# Patient Record
Sex: Female | Born: 1965 | Race: White | Hispanic: No | Marital: Single | Smoking: Former smoker
Health system: Southern US, Community
[De-identification: ages and names within clinical notes are randomized; demographics above are authoritative.]

## PROBLEM LIST (undated history)

## (undated) DIAGNOSIS — E785 Hyperlipidemia, unspecified: Secondary | ICD-10-CM

## (undated) DIAGNOSIS — M1990 Unspecified osteoarthritis, unspecified site: Secondary | ICD-10-CM

## (undated) DIAGNOSIS — F32A Depression, unspecified: Secondary | ICD-10-CM

## (undated) DIAGNOSIS — T7840XA Allergy, unspecified, initial encounter: Secondary | ICD-10-CM

## (undated) HISTORY — PX: EYE SURGERY: SHX253

## (undated) HISTORY — PX: KNEE ARTHROSCOPY W/ MENISCECTOMY: SHX1879

## (undated) HISTORY — PX: JOINT REPLACEMENT: SHX530

## (undated) HISTORY — PX: FRACTURE SURGERY: SHX138

## (undated) HISTORY — DX: Depression, unspecified: F32.A

## (undated) HISTORY — DX: Hyperlipidemia, unspecified: E78.5

## (undated) HISTORY — PX: PERCUTANEOUS PINNING METATARSAL FRACTURE: SUR1015

## (undated) HISTORY — PX: OTHER SURGICAL HISTORY: SHX169

## (undated) HISTORY — DX: Allergy, unspecified, initial encounter: T78.40XA

---

## 2011-09-23 DIAGNOSIS — B009 Herpesviral infection, unspecified: Secondary | ICD-10-CM | POA: Insufficient documentation

## 2017-02-04 ENCOUNTER — Encounter: Payer: Self-pay | Admitting: *Deleted

## 2017-02-04 ENCOUNTER — Ambulatory Visit
Admission: EM | Admit: 2017-02-04 | Discharge: 2017-02-04 | Disposition: A | Discharge status: Home / Self Care | Payer: PRIVATE HEALTH INSURANCE | Attending: Family Medicine | Admitting: Family Medicine

## 2017-02-04 DIAGNOSIS — R05 Cough: Secondary | ICD-10-CM

## 2017-02-04 DIAGNOSIS — R059 Cough, unspecified: Secondary | ICD-10-CM

## 2017-02-04 DIAGNOSIS — J018 Other acute sinusitis: Secondary | ICD-10-CM

## 2017-02-04 LAB — RAPID STREP SCREEN (MED CTR MEBANE ONLY): Streptococcus, Group A Screen (Direct): NEGATIVE

## 2017-02-04 MED ORDER — FLUTICASONE PROPIONATE 50 MCG/ACT NA SUSP
2.0000 | Freq: Every day | NASAL | 0 refills | Status: DC
Start: 2017-02-04 — End: 2023-01-02

## 2017-02-04 MED ORDER — LORATADINE-PSEUDOEPHEDRINE ER 10-240 MG PO TB24: 1.0000 | ORAL_TABLET | Freq: Every day | ORAL | 0 refills | Status: DC

## 2017-02-04 MED ORDER — AMOXICILLIN-POT CLAVULANATE 875-125 MG PO TABS: 1.0000 | ORAL_TABLET | Freq: Two times a day (BID) | ORAL | 0 refills | Status: DC

## 2017-02-04 NOTE — ED Provider Notes (Signed)
[facility]    CSN: [id number] Arrival date & time: 02/04/17  1503     History   Chief Complaint Chief Complaint  Patient presents with  . Cough  . Headache  . Nasal Congestion    HPI Ashley Garcia is a 51 y.o. female.   The history is provided by the patient. No language interpreter was used.  Cough  Cough characteristics:  Non-productive Sputum characteristics:  Nondescript Severity:  Moderate Onset quality:  Sudden Timing:  Constant Progression:  Worsening Chronicity:  New Smoker: yes   Context: upper respiratory infection   Relieved by:  Nothing Worsened by:  Nothing Associated symptoms: headaches   Associated symptoms: no chest pain   Risk factors: recent infection   Headache  Associated symptoms: congestion, cough and sinus pressure     History reviewed. No pertinent past medical history.  There are no active problems to display for this patient.   Past Surgical History:  Procedure Laterality Date  . hip replacment Left   . KNEE ARTHROSCOPY W/ MENISCECTOMY Right     OB History    No data available       Home Medications    Prior to Admission medications   Medication Sig Start Date End Date Taking? Authorizing Provider  amoxicillin-clavulanate (AUGMENTIN) 875-125 MG tablet Take 1 tablet by mouth 2 (two) times daily. 02/04/17   Hassan Rowan, MD  fluticasone (FLONASE) 50 MCG/ACT nasal spray Place 2 sprays into both nostrils daily. 02/04/17   Hassan Rowan, MD  loratadine-pseudoephedrine (CLARITIN-D 24 HOUR) 10-240 MG 24 hr tablet Take 1 tablet by mouth daily. 02/04/17   Hassan Rowan, MD    Family History History reviewed. No pertinent family history.  Social History Social History  Substance Use Topics  . Smoking status: Light Tobacco Smoker    Years: 5.00    Types: Cigars  . Smokeless tobacco: Never Used  . Alcohol use Not on file     Allergies   Patient has no allergy information on record.   Review of Systems Review  of Systems  HENT: Positive for congestion, sinus pain and sinus pressure.   Respiratory: Positive for cough.   Cardiovascular: Negative for chest pain.  Neurological: Positive for headaches.  All other systems reviewed and are negative.    Physical Exam Triage Vital Signs ED Triage Vitals  Enc Vitals Group     BP 02/04/17 1602 (!) 145/80     Pulse Rate 02/04/17 1602 96     Resp 02/04/17 1602 16     Temp 02/04/17 1602 98.4 F (36.9 C)     Temp Source 02/04/17 1602 Oral     SpO2 02/04/17 1602 99 %     Weight 02/04/17 1555 210 lb (95.3 kg)     Height 02/04/17 1555 5\' 6"  (1.676 m)     Head Circumference --      Peak Flow --      Pain Score 02/04/17 1556 3     Pain Loc --      Pain Edu? --      Excl. in GC? --    No data found.   Updated Vital Signs BP (!) 145/80 (BP Location: Right Arm)   Pulse 96   Temp 98.4 F (36.9 C) (Oral)   Resp 16   Ht 5\' 6"  (1.676 m)   Wt 210 lb (95.3 kg)   LMP 12/31/2016 (Approximate)   SpO2 99%   BMI 33.89 kg/m   Visual Acuity Right Eye  Distance:   Left Eye Distance:   Bilateral Distance:    Right Eye Near:   Left Eye Near:    Bilateral Near:     Physical Exam  Constitutional: She is oriented to person, place, and time. She appears well-developed and well-nourished.  HENT:  Head: Normocephalic and atraumatic.  Right Ear: Hearing, external ear and ear canal normal. Tympanic membrane is injected and bulging.  Left Ear: Hearing, external ear and ear canal normal. Tympanic membrane is injected and bulging.  Nose: Mucosal edema present. Right sinus exhibits no maxillary sinus tenderness and no frontal sinus tenderness. Left sinus exhibits no maxillary sinus tenderness and no frontal sinus tenderness.  Mouth/Throat: Uvula is midline and oropharynx is clear and moist.  Eyes: Pupils are equal, round, and reactive to light.  Neck: Normal range of motion. Neck supple.  Cardiovascular: Normal rate and regular rhythm.   Pulmonary/Chest:  Effort normal and breath sounds normal.  Musculoskeletal: Normal range of motion.  Lymphadenopathy:    She has cervical adenopathy.  Neurological: She is alert and oriented to person, place, and time.  Skin: Skin is warm.  Vitals reviewed.    UC Treatments / Results  Labs (all labs ordered are listed, but only abnormal results are displayed) Labs Reviewed  RAPID STREP SCREEN (NOT AT [facility])  CULTURE, GROUP A STREP [facility])    EKG  EKG Interpretation None       Radiology No results found.  Procedures Procedures (including critical care time)  Medications Ordered in UC Medications - No data to display   Initial Impression / Assessment and Plan / UC Course  I have reviewed the triage vital signs and the nursing notes.  Pertinent labs & imaging results that were available during my care of the patient were reviewed by me and considered in my medical decision making (see chart for details).    patient will be given Augmentin 875 one tablet twice day and Tessalon Perles cough Allegra-D or Claritin-D 1 tablet daily follow-up PCP if not better to 3 weeks work note given for today and tomorrow as well.   Final Clinical Impressions(s) / UC Diagnoses   Final diagnoses:  Acute non-recurrent sinusitis of other sinus  Cough    New Prescriptions New Prescriptions   AMOXICILLIN-CLAVULANATE (AUGMENTIN) 875-125 MG TABLET    Take 1 tablet by mouth 2 (two) times daily.   FLUTICASONE (FLONASE) 50 MCG/ACT NASAL SPRAY    Place 2 sprays into both nostrils daily.   LORATADINE-PSEUDOEPHEDRINE (CLARITIN-D 24 HOUR) 10-240 MG 24 HR TABLET    Take 1 tablet by mouth daily.   Note: This dictation was prepared with Dragon dictation along with smaller phrase technology. Any transcriptional errors that result from this process are unintentional. Controlled Substance Prescriptions [place] Controlled Substance Registry consulted? Not Applicable   Hassan Rowan, MD 02/04/17 [phone]

## 2017-02-04 NOTE — ED Triage Notes (Signed)
C/O cold like symptoms. Chest tightness when coughing . Denies fever or soar throat .

## 2017-02-08 LAB — CULTURE, GROUP A STREP (THRC)

## 2017-02-10 ENCOUNTER — Telehealth: Payer: Self-pay | Admitting: *Deleted

## 2017-02-10 NOTE — Telephone Encounter (Signed)
Patient returned phone message. Verified DOB, communicated negative strep culture result. Patient reported persistent symptoms of nasal congestion and sinus pressure. Advised patient to follow up with [facility] or PCP for additional treatment.

## 2017-02-19 ENCOUNTER — Encounter: Payer: Self-pay | Admitting: Internal Medicine

## 2017-02-19 ENCOUNTER — Ambulatory Visit (INDEPENDENT_AMBULATORY_CARE_PROVIDER_SITE_OTHER): Payer: PRIVATE HEALTH INSURANCE | Admitting: Internal Medicine

## 2017-02-19 VITALS — BP 118/74 | HR 71 | Temp 98.5°F | Ht 66.0 in | Wt 202.0 lb

## 2017-02-19 DIAGNOSIS — H6502 Acute serous otitis media, left ear: Secondary | ICD-10-CM | POA: Diagnosis not present

## 2017-02-19 DIAGNOSIS — Z8776 Personal history of (corrected) congenital malformations of integument, limbs and musculoskeletal system: Secondary | ICD-10-CM | POA: Diagnosis not present

## 2017-02-19 DIAGNOSIS — A6009 Herpesviral infection of other urogenital tract: Secondary | ICD-10-CM | POA: Insufficient documentation

## 2017-02-19 DIAGNOSIS — Z87768 Personal history of other specified (corrected) congenital malformations of integument, limbs and musculoskeletal system: Secondary | ICD-10-CM | POA: Insufficient documentation

## 2017-02-19 MED ORDER — PREDNISONE 10 MG PO TABS: ORAL_TABLET | ORAL | 0 refills | Status: DC

## 2017-02-19 MED ORDER — AZITHROMYCIN 250 MG PO TABS: ORAL_TABLET | ORAL | 0 refills | Status: DC

## 2017-02-19 NOTE — Progress Notes (Signed)
Date:  02/19/2017   Name:  Ashley Garcia   DOB:  [birth year]   MRN:  [id number]   Chief Complaint: Establish Care (New to the area- Needs PCP. From [place] ) and Sinusitis (3 week ago. Went to UC 2 week ago. No production. Took antibiotics for 10 days. Sometimes when blowing nose ears pop, and still feels full in sinus. ) Sinusitis  This is a recurrent problem. The problem has been gradually improving since onset. There has been no fever. The pain is mild. Associated symptoms include ear pain, sinus pressure and a sore throat. Pertinent negatives include no chills, coughing, headaches, hoarse voice or shortness of breath. Past treatments include antibiotics.  Completed a course of Augmentin and Flonase. Also took Claritin-D. The pressure is better but she still feels a popping in both ears. Also alteration of taste and smell.    Review of Systems  Constitutional: Negative for chills, fever and unexpected weight change.  HENT: Positive for ear pain, sinus pressure and sore throat. Negative for hoarse voice.   Respiratory: Negative for cough, chest tightness and shortness of breath.   Cardiovascular: Negative for chest pain and palpitations.  Neurological: Negative for dizziness and headaches.  Psychiatric/Behavioral: Positive for dysphoric mood.    There are no active problems to display for this patient.   Prior to Admission medications   Medication Sig Start Date End Date Taking? Authorizing Provider  fluticasone (FLONASE) 50 MCG/ACT nasal spray Place 2 sprays into both nostrils daily. 02/04/17  Yes Hassan Rowan, MD  loratadine-pseudoephedrine (CLARITIN-D 24 HOUR) 10-240 MG 24 hr tablet Take 1 tablet by mouth daily. 02/04/17  Yes Hassan Rowan, MD  Multiple Vitamins-Minerals (MULTIVITAMIN WITH MINERALS) tablet Take 1 tablet by mouth daily.   Yes [provider]  Omega-3 Fatty Acids (FISH OIL) 1000 MG CAPS Take by mouth.   Yes [provider]    No Known  Allergies  Past Surgical History:  Procedure Laterality Date  . hip replacment Left   . KNEE ARTHROSCOPY W/ MENISCECTOMY Right     Social History  Substance Use Topics  . Smoking status: Light Tobacco Smoker    Years: 5.00    Types: Cigars  . Smokeless tobacco: Never Used     Comment: twice a month- cigar  . Alcohol use 4.2 oz/week    7 Glasses of wine per week     Medication list has been reviewed and updated.  PHQ 2/9 Scores 02/19/2017  PHQ - 2 Score 1  PHQ- 9 Score 4    Physical Exam  Constitutional: She is oriented to person, place, and time. She appears well-developed. No distress.  HENT:  Head: Normocephalic and atraumatic.  Right Ear: Ear canal normal. Tympanic membrane is erythematous. Tympanic membrane is not retracted.  Left Ear: Ear canal normal. Tympanic membrane is erythematous. Tympanic membrane is not retracted. A middle ear effusion is present.  Nose: Right sinus exhibits no maxillary sinus tenderness and no frontal sinus tenderness. Left sinus exhibits no maxillary sinus tenderness and no frontal sinus tenderness.  Mouth/Throat: Mucous membranes are dry. No oropharyngeal exudate or posterior oropharyngeal erythema.  Neck: Normal range of motion. Neck supple. No thyromegaly present.  Cardiovascular: Normal rate, regular rhythm and normal heart sounds.   Pulmonary/Chest: Effort normal and breath sounds normal. No respiratory distress. She has no decreased breath sounds. She has no wheezes. She has no rhonchi.  Musculoskeletal: Normal range of motion.  Neurological: She is alert and oriented to person, place,  and time.  Skin: Skin is warm and dry. No rash noted.  Psychiatric: She has a normal mood and affect. Her speech is normal and behavior is normal. Thought content is delusional.  Nursing note and vitals reviewed.   BP 118/74   Pulse 71   Temp 98.5 F (36.9 C)   Ht 5\' 6"  (1.676 m)   Wt 202 lb (91.6 kg)   LMP 01/01/2017 (Approximate)   SpO2 98%    BMI 32.60 kg/m   Assessment and Plan: 1. Acute serous otitis media of left ear, recurrence not specified - azithromycin (ZITHROMAX Z-PAK) 250 MG tablet; UAD  Dispense: 6 each; Refill: 0 - predniSONE (DELTASONE) 10 MG tablet; Take 6 on day 1, 5 on day 2, 4 on day 3, 3 on day 4, 2 on day 5 and 1 on day 1 then stop.  Dispense: 21 tablet; Refill: 0  2. Herpes genitalis in women Continue Valtrex PRN  3. History of congenital dysplasia of hip Doing well s/p hip replacement at age 51   Meds ordered this encounter  Medications  . azithromycin (ZITHROMAX Z-PAK) 250 MG tablet    Sig: UAD    Dispense:  6 each    Refill:  0  . predniSONE (DELTASONE) 10 MG tablet    Sig: Take 6 on day 1, 5 on day 2, 4 on day 3, 3 on day 4, 2 on day 5 and 1 on day 1 then stop.    Dispense:  21 tablet    Refill:  0    Partially dictated using Animal nutritionistDragon software. Any errors are unintentional.  Bari EdwardLaura Shareta Fishbaugh, MD [facility]  02/19/2017

## 2017-05-21 ENCOUNTER — Other Ambulatory Visit: Payer: Self-pay | Admitting: Internal Medicine

## 2017-05-21 ENCOUNTER — Encounter: Payer: Self-pay | Admitting: Internal Medicine

## 2017-05-21 ENCOUNTER — Ambulatory Visit (INDEPENDENT_AMBULATORY_CARE_PROVIDER_SITE_OTHER): Payer: PRIVATE HEALTH INSURANCE | Admitting: Internal Medicine

## 2017-05-21 VITALS — BP 140/82 | HR 83 | Ht 66.0 in | Wt 213.0 lb

## 2017-05-21 DIAGNOSIS — F321 Major depressive disorder, single episode, moderate: Secondary | ICD-10-CM | POA: Diagnosis not present

## 2017-05-21 MED ORDER — CITALOPRAM HYDROBROMIDE 20 MG PO TABS: 20.0000 mg | ORAL_TABLET | Freq: Every day | ORAL | 3 refills | Status: DC

## 2017-05-21 NOTE — Progress Notes (Signed)
Date:  05/21/2017   Name:  Ashley PersonJuliane Wilfong   DOB:  [birth year]   MRN:  [id number]   Chief Complaint: Depression (PHQ9- 17. GAD7- 12)  Depression         This is a recurrent problem.  The onset quality is undetermined.   The problem has been gradually worsening since onset.  Associated symptoms include decreased concentration, decreased interest and sad.  Associated symptoms include no fatigue and no suicidal ideas.     The symptoms are aggravated by social issues and work stress.  Past treatments include nothing. She has started doing yoga.  She has no hx of depression but both parents were treated for depression. She is having trouble finding a full time job.  She is networking but having trouble really meeting people.  She is skipping some social activities due to mild anxiety which is new for her.  Review of Systems  Constitutional: Negative for chills, fatigue and fever.  Respiratory: Negative for chest tightness and shortness of breath.   Cardiovascular: Negative for chest pain and palpitations.  Hematological: Negative for adenopathy.  Psychiatric/Behavioral: Positive for decreased concentration and depression. Negative for sleep disturbance and suicidal ideas. The patient is nervous/anxious.     Patient Active Problem List   Diagnosis Date Noted  . History of congenital dysplasia of hip 02/19/2017  . Herpes genitalis in women 02/19/2017    Prior to Admission medications   Medication Sig Start Date End Date Taking? Authorizing Provider  fluticasone (FLONASE) 50 MCG/ACT nasal spray Place 2 sprays into both nostrils daily. 02/04/17   Hassan RowanWade, Eugene, MD  Multiple Vitamins-Minerals (MULTIVITAMIN WITH MINERALS) tablet Take 1 tablet by mouth daily.    [provider]  Omega-3 Fatty Acids (FISH OIL) 1000 MG CAPS Take by mouth.    [provider]  valACYclovir (VALTREX) 1000 MG tablet Take 1,000 mg by mouth 2 (two) times daily as needed.    [provider]     No Known Allergies  Past Surgical History:  Procedure Laterality Date  . hip replacment Left    age 51  . KNEE ARTHROSCOPY W/ MENISCECTOMY Right     Social History   Tobacco Use  . Smoking status: Light Tobacco Smoker    Years: 5.00    Types: Cigars  . Smokeless tobacco: Never Used  . Tobacco comment: twice a month- cigar  Substance Use Topics  . Alcohol use: Yes    Alcohol/week: 4.2 oz    Types: 7 Glasses of wine per week  . Drug use: No     Medication list has been reviewed and updated.  PHQ 2/9 Scores 05/21/2017 02/19/2017  PHQ - 2 Score 6 1  PHQ- 9 Score 17 4    Physical Exam  Constitutional: She is oriented to Garcia, place, and time. She appears well-developed. No distress.  HENT:  Head: Normocephalic and atraumatic.  Right Ear: Tympanic membrane and ear canal normal.  Left Ear: Tympanic membrane and ear canal normal.  Neck: Normal range of motion. Neck supple.  Cardiovascular: Normal rate, regular rhythm and normal heart sounds.  Pulmonary/Chest: Effort normal and breath sounds normal. No respiratory distress. She has no wheezes.  Musculoskeletal: Normal range of motion.  Neurological: She is alert and oriented to Garcia, place, and time.  Skin: Skin is warm and dry. No rash noted.  Psychiatric: She has a normal mood and affect. Her speech is normal and behavior is normal. Thought content normal.  Nursing note and vitals  reviewed.   BP 140/82   Pulse 83   Ht 5\' 6"  (1.676 m)   Wt 213 lb (96.6 kg)   SpO2 98%   BMI 34.38 kg/m   Assessment and Plan: 1. Current moderate episode of major depressive disorder without prior episode (HCC) Begin medication Follow up if no improvement in 4 weeks or if sx worsen - citalopram (CELEXA) 20 MG tablet; Take 1 tablet (20 mg total) by mouth daily.  Dispense: 30 tablet; Refill: 3   Meds ordered this encounter  Medications  . citalopram (CELEXA) 20 MG tablet    Sig: Take 1 tablet (20 mg total) by mouth daily.     Dispense:  30 tablet    Refill:  3    Partially dictated using Animal nutritionistDragon software. Any errors are unintentional.  Bari EdwardLaura Uniqua Kihn, MD [facility]  05/21/2017

## 2017-05-21 NOTE — Patient Instructions (Signed)
Take 1/2 tablet daily for 4 days then increase to one tablet daily.

## 2017-06-26 ENCOUNTER — Other Ambulatory Visit: Payer: Self-pay | Admitting: Internal Medicine

## 2017-06-26 ENCOUNTER — Telehealth: Payer: Self-pay

## 2017-06-26 DIAGNOSIS — F321 Major depressive disorder, single episode, moderate: Secondary | ICD-10-CM

## 2017-06-26 MED ORDER — FLUOXETINE HCL 20 MG PO TABS: 20.0000 mg | ORAL_TABLET | Freq: Every day | ORAL | 1 refills | Status: DC

## 2017-06-26 NOTE — Telephone Encounter (Signed)
She will likely find thinning hair reported as a possible side effect of many medications.  If she does not have bald patches and is doing better on medication, then I would encourage her to continue it.  If she insists on a change, I can prescribe something else.

## 2017-06-26 NOTE — Telephone Encounter (Signed)
Patient called stating she was just started on Celexa medication a month ago. Noticed hair was thinning- did her own research and found this is a side affect of the medication. Wanted to know if there is something else she can try or what to do? Please Advise.

## 2017-06-26 NOTE — Telephone Encounter (Signed)
Patient stated she insists on medication change. She notices her scalp now hwen her hair is wet and heavy. Please send new medication to [facility] in [place]. Made her a 6 weeks follow up for this new medication.

## 2017-08-06 ENCOUNTER — Ambulatory Visit: Payer: Self-pay | Admitting: Internal Medicine

## 2017-08-19 ENCOUNTER — Ambulatory Visit (INDEPENDENT_AMBULATORY_CARE_PROVIDER_SITE_OTHER): Payer: BLUE CROSS/BLUE SHIELD | Admitting: Internal Medicine

## 2017-08-19 ENCOUNTER — Encounter: Payer: Self-pay | Admitting: Internal Medicine

## 2017-08-19 DIAGNOSIS — F321 Major depressive disorder, single episode, moderate: Secondary | ICD-10-CM | POA: Diagnosis not present

## 2017-08-19 DIAGNOSIS — Z124 Encounter for screening for malignant neoplasm of cervix: Secondary | ICD-10-CM | POA: Diagnosis not present

## 2017-08-19 MED ORDER — FLUOXETINE HCL 20 MG PO TABS: 20.0000 mg | ORAL_TABLET | Freq: Every day | ORAL | 5 refills | Status: DC

## 2017-08-19 NOTE — Patient Instructions (Signed)
Call [facility] if [facility] cover cologuard [phone] CPT code  Dr. Tildon HuskySteinbricker - dentist  [facility] [place]

## 2017-08-19 NOTE — Progress Notes (Signed)
Date:  08/19/2017   Name:  Ashley Garcia   DOB:  1966/03/06   MRN:  [id number]   Chief Complaint: Depression Depression         This is a new problem.  The current episode started more than 1 month ago.   The onset quality is gradual.   The problem has been rapidly improving since onset.  Associated symptoms include no fatigue, no appetite change and no headaches.     The symptoms are aggravated by family issues and social issues.  Past treatments include SSRIs - Selective serotonin reuptake inhibitors.  Compliance with treatment is good.  Previous treatment provided significant relief.  She was started on Celexa but felt that it made her hair fall out so she was changed to Prozac and is here to follow up.  Her hair loss has stopped and she is very satisfied.   Review of Systems  Constitutional: Negative for appetite change, chills, fatigue, fever and unexpected weight change.  HENT: Negative for tinnitus and trouble swallowing.   Eyes: Negative for visual disturbance.  Respiratory: Negative for cough, choking, chest tightness and shortness of breath.   Cardiovascular: Negative for chest pain, palpitations and leg swelling.  Gastrointestinal: Negative for abdominal pain.  Genitourinary: Negative for dysuria and hematuria.  Musculoskeletal: Negative for arthralgias.  Neurological: Negative for tremors, numbness and headaches.  Psychiatric/Behavioral: Positive for depression. Negative for dysphoric mood and sleep disturbance. The patient is not nervous/anxious.     Patient Active Problem List   Diagnosis Date Noted  . Current moderate episode of major depressive disorder without prior episode (HCC) 05/21/2017  . History of congenital dysplasia of hip 02/19/2017  . Herpes genitalis in women 02/19/2017    Prior to Admission medications   Medication Sig Start Date End Date Taking? Authorizing Provider  Ascorbic Acid (VITAMIN C) 1000 MG tablet Take 1,000 mg by mouth daily.   Yes  [provider]  b complex vitamins tablet Take 1 tablet by mouth daily.   Yes [provider]  Biotin 10 MG CAPS Take by mouth.   Yes [provider]  dextromethorphan-guaiFENesin (MUCINEX DM) 30-600 MG 12hr tablet Take 1 tablet by mouth 2 (two) times daily.   Yes [provider]  FLUoxetine (PROZAC) 20 MG tablet Take 1 tablet (20 mg total) by mouth daily. 06/26/17  Yes Reubin Milan, MD  fluticasone [facility]) 50 MCG/ACT nasal spray Place 2 sprays into both nostrils daily. 02/04/17  Yes Hassan Rowan, MD  Multiple Vitamins-Minerals (MULTIVITAMIN WITH MINERALS) tablet Take 1 tablet by mouth daily.   Yes [provider]  Omega-3 Fatty Acids (FISH OIL) 1000 MG CAPS Take by mouth.   Yes [provider]  Probiotic Product (PROBIOTIC-10 PO) Take by mouth.   Yes [provider]  valACYclovir (VALTREX) 1000 MG tablet Take 1,000 mg by mouth 2 (two) times daily as needed.   Yes [provider]    No Known Allergies  Past Surgical History:  Procedure Laterality Date  . hip replacment Left    age 54  . KNEE ARTHROSCOPY W/ MENISCECTOMY Right     Social History   Tobacco Use  . Smoking status: Light Tobacco Smoker    Years: 5.00    Types: Cigars  . Smokeless tobacco: Never Used  . Tobacco comment: twice a month- cigar  Substance Use Topics  . Alcohol use: Yes    Alcohol/week: 4.2 oz    Types: 7 Glasses of wine per  week  . Drug use: No     Medication list has been reviewed and updated.  PHQ 2/9 Scores 08/19/2017 05/21/2017 02/19/2017  PHQ - 2 Score 0 6 1  PHQ- 9 Score 0 17 4    Physical Exam  Constitutional: She is oriented to person, place, and time. She appears well-developed. No distress.  HENT:  Head: Normocephalic and atraumatic.  Neck: Normal range of motion. Neck supple.  Cardiovascular: Normal rate, regular rhythm and normal heart sounds.  Pulmonary/Chest: Effort normal and breath sounds normal. No  respiratory distress. She has no wheezes.  Musculoskeletal: Normal range of motion.  Neurological: She is alert and oriented to person, place, and time.  Skin: Skin is warm and dry. No rash noted.  Psychiatric: She has a normal mood and affect. Her behavior is normal. Thought content normal.  Nursing note and vitals reviewed.   BP 130/80   Pulse 70   Ht 5\' 6"  (1.676 m)   Wt 211 lb (95.7 kg)   SpO2 98%   BMI 34.06 kg/m   Assessment and Plan: 1. Current moderate episode of major depressive disorder without prior episode (HCC) Doing well, continue medication - FLUoxetine (PROZAC) 20 MG tablet; Take 1 tablet (20 mg total) by mouth daily.  Dispense: 30 tablet; Refill: 5  2. Encounter for Pap smear of cervix with HPV DNA cotesting Will need to have this repeated at [facility] this summer   Meds ordered this encounter  Medications  . FLUoxetine (PROZAC) 20 MG tablet    Sig: Take 1 tablet (20 mg total) by mouth daily.    Dispense:  30 tablet    Refill:  5    Partially dictated using Animal nutritionistDragon software. Any errors are unintentional.  Bari EdwardLaura Satish Hammers, MD [facility]  08/19/2017

## 2017-10-24 ENCOUNTER — Other Ambulatory Visit: Payer: Self-pay | Admitting: Internal Medicine

## 2017-10-24 DIAGNOSIS — F321 Major depressive disorder, single episode, moderate: Secondary | ICD-10-CM

## 2017-12-23 ENCOUNTER — Encounter: Payer: Self-pay | Admitting: Internal Medicine

## 2017-12-23 ENCOUNTER — Other Ambulatory Visit (HOSPITAL_COMMUNITY)
Admission: RE | Admit: 2017-12-23 | Discharge: 2017-12-23 | Disposition: A | Discharge status: Home / Self Care | Payer: BLUE CROSS/BLUE SHIELD | Source: Ambulatory Visit | Attending: Internal Medicine | Admitting: Internal Medicine

## 2017-12-23 ENCOUNTER — Ambulatory Visit (INDEPENDENT_AMBULATORY_CARE_PROVIDER_SITE_OTHER): Payer: BLUE CROSS/BLUE SHIELD | Admitting: Internal Medicine

## 2017-12-23 ENCOUNTER — Other Ambulatory Visit: Payer: Self-pay

## 2017-12-23 VITALS — BP 124/68 | HR 87 | Ht 66.0 in | Wt 215.0 lb

## 2017-12-23 DIAGNOSIS — Z1211 Encounter for screening for malignant neoplasm of colon: Secondary | ICD-10-CM | POA: Diagnosis not present

## 2017-12-23 DIAGNOSIS — F321 Major depressive disorder, single episode, moderate: Secondary | ICD-10-CM | POA: Diagnosis not present

## 2017-12-23 DIAGNOSIS — Z1389 Encounter for screening for other disorder: Secondary | ICD-10-CM

## 2017-12-23 DIAGNOSIS — Z124 Encounter for screening for malignant neoplasm of cervix: Secondary | ICD-10-CM | POA: Diagnosis not present

## 2017-12-23 DIAGNOSIS — Z0000 Encounter for general adult medical examination without abnormal findings: Secondary | ICD-10-CM | POA: Diagnosis not present

## 2017-12-23 DIAGNOSIS — Z0001 Encounter for general adult medical examination with abnormal findings: Secondary | ICD-10-CM

## 2017-12-23 DIAGNOSIS — Z1239 Encounter for other screening for malignant neoplasm of breast: Secondary | ICD-10-CM

## 2017-12-23 DIAGNOSIS — Z1231 Encounter for screening mammogram for malignant neoplasm of breast: Secondary | ICD-10-CM

## 2017-12-23 LAB — POCT URINALYSIS DIPSTICK
Bilirubin, UA: NEGATIVE
Blood, UA: NEGATIVE
Glucose, UA: NEGATIVE
Ketones, UA: NEGATIVE
Leukocytes, UA: NEGATIVE
Nitrite, UA: NEGATIVE
Protein, UA: NEGATIVE
Spec Grav, UA: 1.02 (ref 1.010–1.025)
Urobilinogen, UA: 0.2 E.U./dL
pH, UA: 5 (ref 5.0–8.0)

## 2017-12-23 NOTE — Progress Notes (Signed)
Date:  12/23/2017   Name:  Ashley Garcia   DOB:  02-11-1966   MRN:  [id number]   Chief Complaint: Annual Exam Ashley Garcia is a 52 y.o. female who presents today for her Complete Annual Exam. She feels fairly well. She reports exercising none. She reports she is sleeping well. She is due for mammogram.  She is also due for repeat Pap - last year normal Pap with + HPV.  Also has not had a colonoscopy.  Depression         This is a chronic problem.The problem is unchanged.  Associated symptoms include no fatigue and no headaches.  Past treatments include SSRIs - Selective serotonin reuptake inhibitors.  Compliance with treatment is good. She is feeling a little sad today -just lost her beloved dog.  She has also changed jobs and is slightly stressed.    Review of Systems  Constitutional: Negative for chills, fatigue and fever.  HENT: Negative for congestion, hearing loss, tinnitus, trouble swallowing and voice change.   Eyes: Negative for visual disturbance.  Respiratory: Negative for cough, chest tightness, shortness of breath and wheezing.   Cardiovascular: Negative for chest pain, palpitations and leg swelling.  Gastrointestinal: Negative for abdominal pain, constipation, diarrhea and vomiting.  Endocrine: Negative for polydipsia and polyuria.  Genitourinary: Negative for dysuria, frequency, genital sores, vaginal bleeding and vaginal discharge.  Musculoskeletal: Positive for arthralgias (hip pain). Negative for gait problem and joint swelling.  Skin: Negative for color change and rash.  Neurological: Negative for dizziness, tremors, light-headedness and headaches.  Hematological: Negative for adenopathy. Does not bruise/bleed easily.  Psychiatric/Behavioral: Positive for depression. Negative for dysphoric mood and sleep disturbance. The patient is not nervous/anxious.     Patient Active Problem List   Diagnosis Date Noted  . Encounter for Pap smear of cervix with HPV DNA  cotesting 08/19/2017  . Current moderate episode of major depressive disorder without prior episode (HCC) 05/21/2017  . History of congenital dysplasia of hip 02/19/2017  . Herpes genitalis in women 02/19/2017    Prior to Admission medications   Medication Sig Start Date End Date Taking? Authorizing Provider  Ascorbic Acid (VITAMIN C) 1000 MG tablet Take 1,000 mg by mouth daily.   Yes [provider]  b complex vitamins tablet Take 1 tablet by mouth daily.   Yes [provider]  Biotin 10 MG CAPS Take by mouth.   Yes [provider]  dextromethorphan-guaiFENesin (MUCINEX DM) 30-600 MG 12hr tablet Take 1 tablet by mouth 2 (two) times daily.   Yes [provider]  FLUoxetine (PROZAC) 20 MG tablet TAKE 1 TABLET BY MOUTH EVERY DAY 10/24/17  Yes Reubin Milan, MD  fluticasone [facility]) 50 MCG/ACT nasal spray Place 2 sprays into both nostrils daily. 02/04/17  Yes Hassan Rowan, MD  Multiple Vitamins-Minerals (MULTIVITAMIN WITH MINERALS) tablet Take 1 tablet by mouth daily.   Yes [provider]  Omega-3 Fatty Acids (FISH OIL) 1000 MG CAPS Take by mouth.   Yes [provider]  Probiotic Product (PROBIOTIC-10 PO) Take by mouth.   Yes [provider]  valACYclovir (VALTREX) 1000 MG tablet Take 1,000 mg by mouth 2 (two) times daily as needed.   Yes [provider]    No Known Allergies  Past Surgical History:  Procedure Laterality Date  . hip replacment Left    age 67  . KNEE ARTHROSCOPY W/ MENISCECTOMY Right     Social History   Tobacco Use  . Smoking  status: Light Tobacco Smoker    Years: 5.00    Types: Cigars  . Smokeless tobacco: Never Used  . Tobacco comment: twice a month- cigar  Substance Use Topics  . Alcohol use: Yes    Alcohol/week: 4.2 oz    Types: 7 Glasses of wine per week  . Drug use: No     Medication list has been reviewed and updated.  Current Meds  Medication Sig  . Ascorbic Acid (VITAMIN  C) 1000 MG tablet Take 1,000 mg by mouth daily.  Marland Kitchen. b complex vitamins tablet Take 1 tablet by mouth daily.  . Biotin 10 MG CAPS Take by mouth.  . dextromethorphan-guaiFENesin (MUCINEX DM) 30-600 MG 12hr tablet Take 1 tablet by mouth 2 (two) times daily.  Marland Kitchen. FLUoxetine (PROZAC) 20 MG tablet TAKE 1 TABLET BY MOUTH EVERY DAY  . fluticasone (FLONASE) 50 MCG/ACT nasal spray Place 2 sprays into both nostrils daily.  . Multiple Vitamins-Minerals (MULTIVITAMIN WITH MINERALS) tablet Take 1 tablet by mouth daily.  . Omega-3 Fatty Acids (FISH OIL) 1000 MG CAPS Take by mouth.  . Probiotic Product (PROBIOTIC-10 PO) Take by mouth.  . valACYclovir (VALTREX) 1000 MG tablet Take 1,000 mg by mouth 2 (two) times daily as needed.    PHQ 2/9 Scores 12/23/2017 08/19/2017 05/21/2017 02/19/2017  PHQ - 2 Score 2 0 6 1  PHQ- 9 Score 3 0 17 4    Physical Exam  Constitutional: She is oriented to person, place, and time. She appears well-developed and well-nourished. No distress.  HENT:  Head: Normocephalic and atraumatic.  Right Ear: Tympanic membrane and ear canal normal.  Left Ear: Tympanic membrane and ear canal normal.  Nose: Right sinus exhibits no maxillary sinus tenderness. Left sinus exhibits no maxillary sinus tenderness.  Mouth/Throat: Uvula is midline and oropharynx is clear and moist.  Eyes: Conjunctivae and EOM are normal. Right eye exhibits no discharge. Left eye exhibits no discharge. No scleral icterus.  Neck: Normal range of motion. Carotid bruit is not present. No erythema present. No thyromegaly present.  Cardiovascular: Normal rate, regular rhythm, normal heart sounds and normal pulses.  Pulmonary/Chest: Effort normal. No respiratory distress. She has no wheezes. Right breast exhibits no mass, no nipple discharge, no skin change and no tenderness. Left breast exhibits no mass, no nipple discharge, no skin change and no tenderness.  Abdominal: Soft. Bowel sounds are normal. There is no  hepatosplenomegaly. There is no tenderness. There is no CVA tenderness.  Genitourinary: Rectum normal, vagina normal and uterus normal. There is no rash, tenderness or lesion on the right labia. There is no rash or tenderness on the left labia. Cervix exhibits no motion tenderness, no discharge and no friability. Right adnexum displays no mass, no tenderness and no fullness. Left adnexum displays no mass, no tenderness and no fullness.  Musculoskeletal: Normal range of motion.  Lymphadenopathy:    She has no cervical adenopathy.    She has no axillary adenopathy.  Neurological: She is alert and oriented to person, place, and time. She has normal reflexes. No cranial nerve deficit or sensory deficit.  Skin: Skin is warm, dry and intact. No rash noted.  Psychiatric: She has a normal mood and affect. Her speech is normal and behavior is normal. Thought content normal.  Nursing note and vitals reviewed.   BP 124/68 (BP Location: Right Arm, Patient Position: Sitting, Cuff Size: Normal)   Pulse 87   Ht 5\' 6"  (1.676 m)   Wt 215 lb (97.5 kg)  LMP 12/07/2017   SpO2 97%   BMI 34.70 kg/m   Assessment and Plan: 1. Annual physical exam Continue regular exercise and healthy diet - CBC with Differential/Platelet - Comprehensive metabolic panel - Lipid panel - TSH - POCT urinalysis dipstick  2. Breast cancer screening - MM DIGITAL SCREENING BILATERAL; Future  3. Colon cancer screening Check on cologuard coverage - Fecal occult blood, imunochemical  4. Current moderate episode of major depressive disorder without prior episode (HCC) Doing well on SSRI  5. Encounter for Pap smear of cervix with HPV DNA cotesting Normal exam today - Cytology - PAP   No orders of the defined types were placed in this encounter.   Partially dictated using Animal nutritionist. Any errors are unintentional.  Bari Edward, MD [facility]  12/23/2017

## 2017-12-23 NOTE — Patient Instructions (Signed)

## 2017-12-24 ENCOUNTER — Encounter: Payer: Self-pay | Admitting: Internal Medicine

## 2017-12-24 DIAGNOSIS — E782 Mixed hyperlipidemia: Secondary | ICD-10-CM | POA: Insufficient documentation

## 2017-12-24 LAB — COMPREHENSIVE METABOLIC PANEL
ALT: 24 IU/L (ref 0–32)
AST: 23 IU/L (ref 0–40)
Albumin/Globulin Ratio: 1.6 (ref 1.2–2.2)
Albumin: 4.2 g/dL (ref 3.5–5.5)
Alkaline Phosphatase: 72 IU/L (ref 39–117)
BUN/Creatinine Ratio: 16 (ref 9–23)
BUN: 16 mg/dL (ref 6–24)
Bilirubin Total: 0.4 mg/dL (ref 0.0–1.2)
CO2: 22 mmol/L (ref 20–29)
Calcium: 9.5 mg/dL (ref 8.7–10.2)
Chloride: 100 mmol/L (ref 96–106)
Creatinine, Ser: 1.03 mg/dL — ABNORMAL HIGH (ref 0.57–1.00)
GFR calc Af Amer: 73 mL/min/{1.73_m2} (ref >59)
GFR calc non Af Amer: 64 mL/min/{1.73_m2} (ref >59)
Globulin, Total: 2.7 g/dL (ref 1.5–4.5)
Glucose: 74 mg/dL (ref 65–99)
Potassium: 4.6 mmol/L (ref 3.5–5.2)
Sodium: 139 mmol/L (ref 134–144)
Total Protein: 6.9 g/dL (ref 6.0–8.5)

## 2017-12-24 LAB — CBC WITH DIFFERENTIAL/PLATELET
Basophils Absolute: 0.1 10*3/uL (ref 0.0–0.2)
Basos: 1 %
EOS (ABSOLUTE): 0.3 10*3/uL (ref 0.0–0.4)
Eos: 4 %
Hematocrit: 40.5 % (ref 34.0–46.6)
Hemoglobin: 13.8 g/dL (ref 11.1–15.9)
Immature Grans (Abs): 0 10*3/uL (ref 0.0–0.1)
Immature Granulocytes: 0 %
Lymphocytes Absolute: 2.3 10*3/uL (ref 0.7–3.1)
Lymphs: 27 %
MCH: 31.5 pg (ref 26.6–33.0)
MCHC: 34.1 g/dL (ref 31.5–35.7)
MCV: 93 fL (ref 79–97)
Monocytes Absolute: 0.6 10*3/uL (ref 0.1–0.9)
Monocytes: 7 %
Neutrophils Absolute: 5.2 10*3/uL (ref 1.4–7.0)
Neutrophils: 61 %
Platelets: 395 10*3/uL (ref 150–450)
RBC: 4.38 x10E6/uL (ref 3.77–5.28)
RDW: 12 % — ABNORMAL LOW (ref 12.3–15.4)
WBC: 8.5 10*3/uL (ref 3.4–10.8)

## 2017-12-24 LAB — LIPID PANEL
Chol/HDL Ratio: 6.1 ratio — ABNORMAL HIGH (ref 0.0–4.4)
Cholesterol, Total: 303 mg/dL — ABNORMAL HIGH (ref 100–199)
HDL: 50 mg/dL (ref >39)
Triglycerides: 411 mg/dL — ABNORMAL HIGH (ref 0–149)

## 2017-12-24 LAB — CYTOLOGY - PAP: Diagnosis: NEGATIVE

## 2017-12-24 LAB — TSH: TSH: 1.92 u[IU]/mL (ref 0.450–4.500)

## 2017-12-29 ENCOUNTER — Other Ambulatory Visit: Payer: Self-pay | Admitting: *Deleted

## 2017-12-29 ENCOUNTER — Inpatient Hospital Stay
Admission: RE | Admit: 2017-12-29 | Discharge: 2017-12-29 | Disposition: A | Discharge status: Home / Self Care | Payer: Self-pay | Source: Ambulatory Visit | Attending: *Deleted | Admitting: *Deleted

## 2017-12-29 DIAGNOSIS — Z9289 Personal history of other medical treatment: Secondary | ICD-10-CM

## 2018-01-07 ENCOUNTER — Ambulatory Visit
Admission: RE | Admit: 2018-01-07 | Discharge: 2018-01-07 | Disposition: A | Discharge status: Home / Self Care | Payer: BLUE CROSS/BLUE SHIELD | Source: Ambulatory Visit | Attending: Internal Medicine | Admitting: Internal Medicine

## 2018-01-07 DIAGNOSIS — Z1231 Encounter for screening mammogram for malignant neoplasm of breast: Secondary | ICD-10-CM | POA: Diagnosis not present

## 2018-01-07 DIAGNOSIS — Z1239 Encounter for other screening for malignant neoplasm of breast: Secondary | ICD-10-CM

## 2018-01-26 ENCOUNTER — Other Ambulatory Visit: Payer: Self-pay | Admitting: Internal Medicine

## 2018-01-26 DIAGNOSIS — F321 Major depressive disorder, single episode, moderate: Secondary | ICD-10-CM

## 2018-01-30 ENCOUNTER — Other Ambulatory Visit: Payer: Self-pay

## 2018-01-30 ENCOUNTER — Other Ambulatory Visit: Payer: Self-pay | Admitting: Internal Medicine

## 2018-01-30 DIAGNOSIS — F321 Major depressive disorder, single episode, moderate: Secondary | ICD-10-CM

## 2018-01-30 MED ORDER — FLUOXETINE HCL 20 MG PO TABS: 20.0000 mg | ORAL_TABLET | Freq: Every day | ORAL | 1 refills | Status: DC

## 2018-01-30 MED ORDER — FLUOXETINE HCL 20 MG PO TABS: 20.0000 mg | ORAL_TABLET | Freq: Every day | ORAL | 3 refills | Status: DC

## 2018-02-25 ENCOUNTER — Encounter: Payer: Self-pay | Admitting: Internal Medicine

## 2018-02-25 ENCOUNTER — Ambulatory Visit (INDEPENDENT_AMBULATORY_CARE_PROVIDER_SITE_OTHER): Payer: BLUE CROSS/BLUE SHIELD | Admitting: Internal Medicine

## 2018-02-25 VITALS — BP 124/80 | HR 62 | Temp 98.2°F | Ht 66.0 in | Wt 216.0 lb

## 2018-02-25 DIAGNOSIS — H6690 Otitis media, unspecified, unspecified ear: Secondary | ICD-10-CM

## 2018-02-25 DIAGNOSIS — J40 Bronchitis, not specified as acute or chronic: Secondary | ICD-10-CM

## 2018-02-25 MED ORDER — AMOXICILLIN-POT CLAVULANATE 875-125 MG PO TABS: 1.0000 | ORAL_TABLET | Freq: Two times a day (BID) | ORAL | 0 refills | Status: AC

## 2018-02-25 NOTE — Progress Notes (Signed)
Date:  02/25/2018   Name:  Ashley Garcia   DOB:  May 09, 1966   MRN:  [id number]   Chief Complaint: Sinusitis (Did E visit and was given inhaler. Cough with congestion in face. Also, tried mucinex. Cough brings up yellow mucous.)  Sinusitis  This is a new problem. The current episode started in the past 7 days. The problem has been gradually worsening since onset. There has been no fever. Associated symptoms include coughing, ear pain, shortness of breath, sinus pressure and a sore throat. Pertinent negatives include no chills or headaches. Past treatments include oral decongestants. The treatment provided mild relief.   She started with cough and congestion over the weekend.  She had a tele-health visit through her insurance company and was given albuterol inhaler and tessalon.  She is not improving and now cough is more productive.  Review of Systems  Constitutional: Negative for chills, fatigue and fever.  HENT: Positive for ear pain, sinus pressure and sore throat.   Respiratory: Positive for cough, chest tightness, shortness of breath and wheezing.   Cardiovascular: Negative for chest pain and palpitations.  Gastrointestinal: Negative for diarrhea and vomiting.  Neurological: Negative for dizziness and headaches.    Patient Active Problem List   Diagnosis Date Noted  . Mixed hyperlipidemia 12/24/2017  . Encounter for Pap smear of cervix with HPV DNA cotesting 08/19/2017  . Current moderate episode of major depressive disorder without prior episode (HCC) 05/21/2017  . History of congenital dysplasia of hip 02/19/2017  . Herpes genitalis in women 02/19/2017    No Known Allergies  Past Surgical History:  Procedure Laterality Date  . hip replacment Left    age 38  . KNEE ARTHROSCOPY W/ MENISCECTOMY Right     Social History   Tobacco Use  . Smoking status: Light Tobacco Smoker    Years: 5.00    Types: Cigars  . Smokeless tobacco: Never Used  . Tobacco comment: twice a  month- cigar  Substance Use Topics  . Alcohol use: Yes    Alcohol/week: 7.0 standard drinks    Types: 7 Glasses of wine per week  . Drug use: No     Medication list has been reviewed and updated.  Current Meds  Medication Sig  . albuterol (PROVENTIL HFA;VENTOLIN HFA) 108 (90 Base) MCG/ACT inhaler   . Ascorbic Acid (VITAMIN C) 1000 MG tablet Take 1,000 mg by mouth daily.  Marland Kitchen b complex vitamins tablet Take 1 tablet by mouth daily.  . benzonatate (TESSALON) 100 MG capsule TAKE ONE CAPSULE BY MOUTH 3 TIMES A DAY AS NEEDED FOR COUGH  . Biotin 10 MG CAPS Take by mouth.  Marland Kitchen FLUoxetine (PROZAC) 20 MG tablet Take 1 tablet (20 mg total) by mouth daily. Generic Prozac tablet  . fluticasone (FLONASE) 50 MCG/ACT nasal spray Place 2 sprays into both nostrils daily.  . Multiple Vitamins-Minerals (MULTIVITAMIN WITH MINERALS) tablet Take 1 tablet by mouth daily.  . Omega-3 Fatty Acids (FISH OIL) 1000 MG CAPS Take by mouth.  . Probiotic Product (PROBIOTIC-10 PO) Take by mouth.  . valACYclovir (VALTREX) 1000 MG tablet Take 1,000 mg by mouth 2 (two) times daily as needed.    PHQ 2/9 Scores 12/23/2017 08/19/2017 05/21/2017 02/19/2017  PHQ - 2 Score 2 0 6 1  PHQ- 9 Score 3 0 17 4    Physical Exam  Constitutional: She is oriented to person, place, and time. She appears well-developed and well-nourished.  HENT:  Right Ear: External ear and ear canal  normal. Tympanic membrane is erythematous and retracted.  Left Ear: External ear and ear canal normal. Tympanic membrane is erythematous and retracted.  Nose: Right sinus exhibits maxillary sinus tenderness. Right sinus exhibits no frontal sinus tenderness. Left sinus exhibits maxillary sinus tenderness. Left sinus exhibits no frontal sinus tenderness.  Mouth/Throat: Uvula is midline and mucous membranes are normal. No oral lesions. No oropharyngeal exudate, posterior oropharyngeal edema or posterior oropharyngeal erythema.  Cardiovascular: Normal rate, regular  rhythm and normal heart sounds.  Pulmonary/Chest: She has decreased breath sounds (bronchial sounds) in the right upper field and the left upper field. She has no wheezes. She has no rales.  Lymphadenopathy:    She has no cervical adenopathy.  Neurological: She is alert and oriented to person, place, and time.    BP 124/80 (BP Location: Right Arm, Patient Position: Sitting, Cuff Size: Normal)   Pulse 62   Temp 98.2 F (36.8 C) (Oral)   Ht 5\' 6"  (1.676 m)   Wt 216 lb (98 kg)   SpO2 98%   BMI 34.86 kg/m   Assessment and Plan: 1. Acute otitis media, unspecified otitis media type Continue inhaler if helpful Begin mucinex or mucinex-D twice a day - amoxicillin-clavulanate (AUGMENTIN) 875-125 MG tablet; Take 1 tablet by mouth 2 (two) times daily for 10 days.  Dispense: 20 tablet; Refill: 0  2. Bronchitis Did not respond to conservative management instituted 4 days ago via Tele-health visit - amoxicillin-clavulanate (AUGMENTIN) 875-125 MG tablet; Take 1 tablet by mouth 2 (two) times daily for 10 days.  Dispense: 20 tablet; Refill: 0   Meds ordered this encounter  Medications  . amoxicillin-clavulanate (AUGMENTIN) 875-125 MG tablet    Sig: Take 1 tablet by mouth 2 (two) times daily for 10 days.    Dispense:  20 tablet    Refill:  0    Partially dictated using Animal nutritionist. Any errors are unintentional.  Bari Edward, MD [facility]  02/25/2018

## 2018-02-25 NOTE — Patient Instructions (Signed)
Mucinex (D in the AM and plain at night)  2 tabs twice a day  Continue to use the inhaler  Drink plenty of fluids

## 2018-07-10 ENCOUNTER — Encounter: Payer: Self-pay | Admitting: Emergency Medicine

## 2018-07-10 ENCOUNTER — Ambulatory Visit
Admission: EM | Admit: 2018-07-10 | Discharge: 2018-07-10 | Disposition: A | Discharge status: Home / Self Care | Payer: BLUE CROSS/BLUE SHIELD | Attending: Emergency Medicine | Admitting: Emergency Medicine

## 2018-07-10 DIAGNOSIS — J101 Influenza due to other identified influenza virus with other respiratory manifestations: Secondary | ICD-10-CM | POA: Diagnosis not present

## 2018-07-10 DIAGNOSIS — R059 Cough, unspecified: Secondary | ICD-10-CM

## 2018-07-10 DIAGNOSIS — R05 Cough: Secondary | ICD-10-CM | POA: Insufficient documentation

## 2018-07-10 LAB — RAPID INFLUENZA A&B ANTIGENS
Influenza A (ARMC): POSITIVE — AB
Influenza B (ARMC): NEGATIVE

## 2018-07-10 MED ORDER — OSELTAMIVIR PHOSPHATE 75 MG PO CAPS: 75.0000 mg | ORAL_CAPSULE | Freq: Two times a day (BID) | ORAL | 0 refills | Status: AC

## 2018-07-10 NOTE — ED Provider Notes (Signed)
[facility]    CSN: [id number] Arrival date & time: 07/10/18  0904     History   Chief Complaint Chief Complaint  Patient presents with  . Cough    HPI Ashley Garcia is a 53 y.o. female.   53 year old female presents with cough, chest congestion, fever of 100, chills, body aches, nasal congestion and headache for the past 2 days. Also has a decrease in appetite but no vomiting or diarrhea. Has taken Nyquil and Tylenol with some relief. Many people ill at work with influenza. Did not have the flu vaccine this season. Other chronic health issues include allergies and currently on Flonase daily and Albuterol prn. Other meds include Prozac, Valtrex and multiple vitamins and supplements daily.   The history is provided by the patient.    Past Medical History:  Diagnosis Date  . Allergy     Patient Active Problem List   Diagnosis Date Noted  . Mixed hyperlipidemia 12/24/2017  . Encounter for Pap smear of cervix with HPV DNA cotesting 08/19/2017  . Current moderate episode of major depressive disorder without prior episode (HCC) 05/21/2017  . History of congenital dysplasia of hip 02/19/2017  . Herpes genitalis in women 02/19/2017    Past Surgical History:  Procedure Laterality Date  . hip replacment Left    age 73  . KNEE ARTHROSCOPY W/ MENISCECTOMY Right     OB History   No obstetric history on file.      Home Medications    Prior to Admission medications   Medication Sig Start Date End Date Taking? Authorizing Provider  albuterol (PROVENTIL HFA;VENTOLIN HFA) 108 (90 Base) MCG/ACT inhaler  02/20/18  Yes [provider]  Ascorbic Acid (VITAMIN C) 1000 MG tablet Take 1,000 mg by mouth daily.   Yes [provider]  b complex vitamins tablet Take 1 tablet by mouth daily.   Yes [provider]  Biotin 10 MG CAPS Take by mouth.   Yes [provider]  FLUoxetine (PROZAC) 20 MG tablet Take 1 tablet (20 mg total) by mouth  daily. Generic Prozac tablet 01/30/18  Yes Reubin Milan, MD  fluticasone [facility]) 50 MCG/ACT nasal spray Place 2 sprays into both nostrils daily. 02/04/17  Yes Hassan Rowan, MD  Multiple Vitamins-Minerals (MULTIVITAMIN WITH MINERALS) tablet Take 1 tablet by mouth daily.   Yes [provider]  Omega-3 Fatty Acids (FISH OIL) 1000 MG CAPS Take by mouth.   Yes [provider]  valACYclovir (VALTREX) 1000 MG tablet Take 1,000 mg by mouth 2 (two) times daily as needed.   Yes [provider]  oseltamivir (TAMIFLU) 75 MG capsule Take 1 capsule (75 mg total) by mouth every 12 (twelve) hours for 5 days. 07/10/18 07/15/18  Sudie Grumbling, NP    Family History Family History  Problem Relation Age of Onset  . Hypertension Mother   . Liver cancer Father   . Breast cancer Paternal Aunt 8    Social History Social History   Tobacco Use  . Smoking status: Light Tobacco Smoker    Years: 5.00    Types: Cigars  . Smokeless tobacco: Never Used  . Tobacco comment: twice a month- cigar  Substance Use Topics  . Alcohol use: Yes    Alcohol/week: 7.0 standard drinks    Types: 7 Glasses of wine per week  . Drug use: No     Allergies   Patient has no known allergies.   Review of Systems Review  of Systems  Constitutional: Positive for appetite change, chills, fatigue and fever. Negative for activity change.  HENT: Positive for congestion, postnasal drip, sinus pressure and sore throat. Negative for ear discharge, ear pain, facial swelling, mouth sores, nosebleeds, rhinorrhea, sinus pain, sneezing and trouble swallowing.   Eyes: Negative for pain, discharge, redness and itching.  Respiratory: Positive for cough. Negative for chest tightness, shortness of breath and wheezing.   Gastrointestinal: Negative for abdominal pain, diarrhea, nausea and vomiting.  Musculoskeletal: Positive for arthralgias and myalgias. Negative for neck pain and neck stiffness.  Skin: Negative for  color change, rash and wound.  Allergic/Immunologic: Positive for environmental allergies.  Neurological: Positive for headaches. Negative for dizziness, tremors, seizures, syncope, weakness, light-headedness and numbness.  Hematological: Negative for adenopathy. Does not bruise/bleed easily.     Physical Exam Triage Vital Signs ED Triage Vitals  Enc Vitals Group     BP 07/10/18 0931 132/90     Pulse Rate 07/10/18 0931 (!) 106     Resp 07/10/18 0931 18     Temp 07/10/18 0931 99.1 F (37.3 C)     Temp Source 07/10/18 0931 Oral     SpO2 07/10/18 0931 98 %     Weight 07/10/18 0928 215 lb (97.5 kg)     Height 07/10/18 0928 5\' 6"  (1.676 m)     Head Circumference --      Peak Flow --      Pain Score 07/10/18 0928 2     Pain Loc --      Pain Edu? --      Excl. in GC? --    No data found.  Updated Vital Signs BP 132/90 (BP Location: Left Arm)   Pulse (!) 106   Temp 99.1 F (37.3 C) (Oral)   Resp 18   Ht 5\' 6"  (1.676 m)   Wt 215 lb (97.5 kg)   LMP 06/08/2018 (Approximate)   SpO2 98%   BMI 34.70 kg/m   Visual Acuity Right Eye Distance:   Left Eye Distance:   Bilateral Distance:    Right Eye Near:   Left Eye Near:    Bilateral Near:     Physical Exam Vitals signs reviewed.  Constitutional:      General: She is awake. She is not in acute distress.    Appearance: Normal appearance. She is well-developed, well-groomed and overweight. She is ill-appearing.     Comments: Patient sitting comfortably on exam table in no acute distress.   HENT:     Head: Normocephalic and atraumatic.     Right Ear: Hearing, ear canal and external ear normal. Tympanic membrane is bulging. Tympanic membrane is not injected or erythematous.     Left Ear: Hearing, ear canal and external ear normal. Tympanic membrane is bulging. Tympanic membrane is not injected or erythematous.     Nose: Congestion present.     Right Sinus: No maxillary sinus tenderness or frontal sinus tenderness.     Left  Sinus: No maxillary sinus tenderness or frontal sinus tenderness.     Mouth/Throat:     Lips: Pink.     Pharynx: Uvula midline. Posterior oropharyngeal erythema present. No pharyngeal swelling or oropharyngeal exudate.  Eyes:     Extraocular Movements: Extraocular movements intact.     Conjunctiva/sclera: Conjunctivae normal.  Neck:     Musculoskeletal: Normal range of motion and neck supple. No muscular tenderness.  Cardiovascular:     Rate and Rhythm: Regular rhythm. Tachycardia present.  Pulses: Normal pulses.     Heart sounds: Normal heart sounds. No murmur.  Pulmonary:     Effort: Pulmonary effort is normal. No respiratory distress.     Breath sounds: No decreased air movement. Examination of the right-upper field reveals decreased breath sounds and wheezing. Examination of the left-upper field reveals decreased breath sounds and wheezing. Examination of the right-lower field reveals wheezing. Examination of the left-lower field reveals wheezing. Decreased breath sounds and wheezing present. No rhonchi or rales.  Musculoskeletal: Normal range of motion.  Lymphadenopathy:     Cervical: No cervical adenopathy.  Skin:    General: Skin is warm and dry.     Capillary Refill: Capillary refill takes less than 2 seconds.  Neurological:     General: No focal deficit present.     Mental Status: She is alert and oriented to person, place, and time.  Psychiatric:        Mood and Affect: Mood normal.        Behavior: Behavior normal. Behavior is cooperative.        Thought Content: Thought content normal.        Judgment: Judgment normal.      UC Treatments / Results  Labs (all labs ordered are listed, but only abnormal results are displayed) Labs Reviewed  RAPID INFLUENZA A&B ANTIGENS ([facility] ONLY) - Abnormal; Notable for the following components:      Result Value   Influenza A ([facility]) POSITIVE (*)    All other components within normal limits    EKG None  Radiology No  results found.  Procedures Procedures (including critical care time)  Medications Ordered in UC Medications - No data to display  Initial Impression / Assessment and Plan / UC Course  I have reviewed the triage vital signs and the nursing notes.  Pertinent labs & imaging results that were available during my care of the patient were reviewed by me and considered in my medical decision making (see chart for details).    Reviewed positive rapid influenza A test. Discussed treatment options. Patient desires antiviral medications. Will start Tamiflu 75mg  twice a day as directed. Continue to push fluids to help loosen up mucus. May continue Flonase as directed and use Albuterol inhaler every 4 to 6 hours as needed for cough and wheezing. Patient declines any Rx strength cough medication. May take OTC Mucinex DM as directed. Note written for work. Follow-up with her PCP in 3 to 4 days if not improving.  Final Clinical Impressions(s) / UC Diagnoses   Final diagnoses:  Influenza A  Cough     Discharge Instructions     Recommend start Tamiflu 75mg  twice a day as directed. Continue to push fluids to help loosen up mucus. Continue Flonase and Albuterol as directed. May take Mucinex DM twice a day as directed. Follow-up with your PCP in 3 to 4 days if not improving.     ED Prescriptions    Medication Sig Dispense Auth. Provider   oseltamivir (TAMIFLU) 75 MG capsule Take 1 capsule (75 mg total) by mouth every 12 (twelve) hours for 5 days. 10 capsule Sudie Grumbling, NP     Controlled Substance Prescriptions [place] Controlled Substance Registry consulted? N/A   Sudie Grumbling, NP 07/10/18 1542

## 2018-07-10 NOTE — ED Triage Notes (Signed)
Pt c/o cough, wheezing, nasal congestion, chest pain, chills, body aches, and fever (100.3). Started about 2 days ago.

## 2018-07-10 NOTE — Discharge Instructions (Addendum)
Recommend start Tamiflu 75mg  twice a day as directed. Continue to push fluids to help loosen up mucus. Continue Flonase and Albuterol as directed. May take Mucinex DM twice a day as directed. Follow-up with your PCP in 3 to 4 days if not improving.

## 2018-07-15 ENCOUNTER — Other Ambulatory Visit: Payer: Self-pay | Admitting: Internal Medicine

## 2018-07-15 DIAGNOSIS — F321 Major depressive disorder, single episode, moderate: Secondary | ICD-10-CM

## 2018-12-25 ENCOUNTER — Other Ambulatory Visit: Payer: Self-pay

## 2018-12-25 ENCOUNTER — Encounter: Payer: Self-pay | Admitting: Internal Medicine

## 2018-12-25 ENCOUNTER — Ambulatory Visit (INDEPENDENT_AMBULATORY_CARE_PROVIDER_SITE_OTHER): Payer: BC Managed Care – PPO | Admitting: Internal Medicine

## 2018-12-25 VITALS — BP 136/72 | HR 68 | Ht 66.0 in | Wt 225.0 lb

## 2018-12-25 DIAGNOSIS — Z1211 Encounter for screening for malignant neoplasm of colon: Secondary | ICD-10-CM

## 2018-12-25 DIAGNOSIS — Z1231 Encounter for screening mammogram for malignant neoplasm of breast: Secondary | ICD-10-CM | POA: Diagnosis not present

## 2018-12-25 DIAGNOSIS — N951 Menopausal and female climacteric states: Secondary | ICD-10-CM

## 2018-12-25 DIAGNOSIS — E782 Mixed hyperlipidemia: Secondary | ICD-10-CM | POA: Diagnosis not present

## 2018-12-25 DIAGNOSIS — A6009 Herpesviral infection of other urogenital tract: Secondary | ICD-10-CM | POA: Diagnosis not present

## 2018-12-25 DIAGNOSIS — Z0000 Encounter for general adult medical examination without abnormal findings: Secondary | ICD-10-CM | POA: Diagnosis not present

## 2018-12-25 DIAGNOSIS — F321 Major depressive disorder, single episode, moderate: Secondary | ICD-10-CM | POA: Diagnosis not present

## 2018-12-25 LAB — POCT URINALYSIS DIPSTICK
Bilirubin, UA: NEGATIVE
Blood, UA: NEGATIVE
Glucose, UA: NEGATIVE
Ketones, UA: NEGATIVE
Leukocytes, UA: NEGATIVE
Nitrite, UA: NEGATIVE
Protein, UA: NEGATIVE
Spec Grav, UA: 1.01 (ref 1.010–1.025)
Urobilinogen, UA: 0.2 E.U./dL
pH, UA: 6 (ref 5.0–8.0)

## 2018-12-25 MED ORDER — VALACYCLOVIR HCL 1 G PO TABS: 1000.0000 mg | ORAL_TABLET | Freq: Two times a day (BID) | ORAL | 0 refills | Status: DC | PRN

## 2018-12-25 NOTE — Patient Instructions (Signed)
Increase Prozac to 1 and 1/2 tab daily.  Call for new prescriptions if needed.   Health Maintenance, Female Adopting a healthy lifestyle and getting preventive care are important in promoting health and wellness. Ask your health care provider about:  The right schedule for you to have regular tests and exams.  Things you can do on your own to prevent diseases and keep yourself healthy. What should I know about diet, weight, and exercise? Eat a healthy diet   Eat a diet that includes plenty of vegetables, fruits, low-fat dairy products, and lean protein.  Do not eat a lot of foods that are high in solid fats, added sugars, or sodium. Maintain a healthy weight Body mass index (BMI) is used to identify weight problems. It estimates body fat based on height and weight. Your health care provider can help determine your BMI and help you achieve or maintain a healthy weight. Get regular exercise Get regular exercise. This is one of the most important things you can do for your health. Most adults should:  Exercise for at least 150 minutes each week. The exercise should increase your heart rate and make you sweat (moderate-intensity exercise).  Do strengthening exercises at least twice a week. This is in addition to the moderate-intensity exercise.  Spend less time sitting. Even light physical activity can be beneficial. Watch cholesterol and blood lipids Have your blood tested for lipids and cholesterol at 52 years of age, then have this test every 5 years. Have your cholesterol levels checked more often if:  Your lipid or cholesterol levels are high.  You are older than 52 years of age.  You are at high risk for heart disease. What should I know about cancer screening? Depending on your health history and family history, you may need to have cancer screening at various ages. This may include screening for:  Breast cancer.  Cervical cancer.  Colorectal cancer.  Skin cancer.   Lung cancer. What should I know about heart disease, diabetes, and high blood pressure? Blood pressure and heart disease  High blood pressure causes heart disease and increases the risk of stroke. This is more likely to develop in people who have high blood pressure readings, are of African descent, or are overweight.  Have your blood pressure checked: ? Every 3-5 years if you are 18-10 years of age. ? Every year if you are 45 years old or older. Diabetes Have regular diabetes screenings. This checks your fasting blood sugar level. Have the screening done:  Once every three years after age 43 if you are at a normal weight and have a low risk for diabetes.  More often and at a younger age if you are overweight or have a high risk for diabetes. What should I know about preventing infection? Hepatitis B If you have a higher risk for hepatitis B, you should be screened for this virus. Talk with your health care provider to find out if you are at risk for hepatitis B infection. Hepatitis C Testing is recommended for:  Everyone born from [birth year] through 1964.  Anyone with known risk factors for hepatitis C. Sexually transmitted infections (STIs)  Get screened for STIs, including gonorrhea and chlamydia, if: ? You are sexually active and are younger than 52 years of age. ? You are older than 52 years of age and your health care provider tells you that you are at risk for this type of infection. ? Your sexual activity has changed since you were last  screened, and you are at increased risk for chlamydia or gonorrhea. Ask your health care provider if you are at risk.  Ask your health care provider about whether you are at high risk for HIV. Your health care provider may recommend a prescription medicine to help prevent HIV infection. If you choose to take medicine to prevent HIV, you should first get tested for HIV. You should then be tested every 3 months for as long as you are taking the  medicine. Pregnancy  If you are about to stop having your period (premenopausal) and you may become pregnant, seek counseling before you get pregnant.  Take 400 to 800 micrograms (mcg) of folic acid every day if you become pregnant.  Ask for birth control (contraception) if you want to prevent pregnancy. Osteoporosis and menopause Osteoporosis is a disease in which the bones lose minerals and strength with aging. This can result in bone fractures. If you are 55 years old or older, or if you are at risk for osteoporosis and fractures, ask your health care provider if you should:  Be screened for bone loss.  Take a calcium or vitamin D supplement to lower your risk of fractures.  Be given hormone replacement therapy (HRT) to treat symptoms of menopause. Follow these instructions at home: Lifestyle  Do not use any products that contain nicotine or tobacco, such as cigarettes, e-cigarettes, and chewing tobacco. If you need help quitting, ask your health care provider.  Do not use street drugs.  Do not share needles.  Ask your health care provider for help if you need support or information about quitting drugs. Alcohol use  Do not drink alcohol if: ? Your health care provider tells you not to drink. ? You are pregnant, may be pregnant, or are planning to become pregnant.  If you drink alcohol: ? Limit how much you use to 0-1 drink a day. ? Limit intake if you are breastfeeding.  Be aware of how much alcohol is in your drink. In the [place] one drink equals one 12 oz bottle of beer (355 mL), one 5 oz glass of wine (148 mL), or one 1 oz glass of hard liquor (44 mL). General instructions  Schedule regular health, dental, and eye exams.  Stay current with your vaccines.  Tell your health care provider if: ? You often feel depressed. ? You have ever been abused or do not feel safe at home. Summary  Adopting a healthy lifestyle and getting preventive care are important in  promoting health and wellness.  Follow your health care provider's instructions about healthy diet, exercising, and getting tested or screened for diseases.  Follow your health care provider's instructions on monitoring your cholesterol and blood pressure. This information is not intended to replace advice given to you by your health care provider. Make sure you discuss any questions you have with your health care provider. Document Released: 12/17/2010 Document Revised: 05/27/2018 Document Reviewed: 05/27/2018 Elsevier Patient Education  2020 [employer].

## 2018-12-25 NOTE — Progress Notes (Signed)
Date:  12/25/2018   Name:  Ashley Garcia   DOB:  1965-09-06   MRN:  [id number]   Chief Complaint: Annual Exam (Breast Exam. Depression= PHQ9: 10 ) Ashley Garcia is a 53 y.o. female who presents today for her Complete Annual Exam. She feels fairly well. She reports exercising none. She reports she is sleeping fairly well. She is having irregular periods and hot flashes. She denies breast issues.  She is taking estroven.  Mammogram 12/2017 Colonoscopy - none (FIT ordered last year but never received) Pap  12/2017  Depression        This is a chronic problem.  The problem has been resolved since onset.  Associated symptoms include no fatigue and no headaches.  Past treatments include SSRIs - Selective serotonin reuptake inhibitors.  Compliance with treatment is good.  Previous treatment provided significant relief.   Review of Systems  Constitutional: Negative for chills, fatigue and fever.  HENT: Negative for congestion, hearing loss, tinnitus, trouble swallowing and voice change.   Eyes: Negative for visual disturbance.  Respiratory: Positive for chest tightness. Negative for cough, shortness of breath and wheezing.   Cardiovascular: Negative for chest pain, palpitations and leg swelling.  Gastrointestinal: Negative for abdominal pain, constipation, diarrhea and vomiting.  Endocrine: Negative for polydipsia and polyuria.  Genitourinary: Negative for dysuria, frequency, genital sores, vaginal bleeding and vaginal discharge.  Musculoskeletal: Positive for arthralgias (right hip). Negative for gait problem and joint swelling.  Skin: Negative for color change and rash.  Neurological: Negative for dizziness, tremors, light-headedness and headaches.  Hematological: Negative for adenopathy. Does not bruise/bleed easily.  Psychiatric/Behavioral: Positive for depression. Negative for dysphoric mood and sleep disturbance. The patient is not nervous/anxious.     Patient Active Problem List    Diagnosis Date Noted  . Mixed hyperlipidemia 12/24/2017  . Encounter for Pap smear of cervix with HPV DNA cotesting 08/19/2017  . Current moderate episode of major depressive disorder without prior episode ([place]) 05/21/2017  . History of congenital dysplasia of hip 02/19/2017  . Herpes genitalis in women 02/19/2017    No Known Allergies  Past Surgical History:  Procedure Laterality Date  . hip replacment Left    age 54  . KNEE ARTHROSCOPY W/ MENISCECTOMY Right     Social History   Tobacco Use  . Smoking status: Light Tobacco Smoker    Years: 5.00    Types: Cigars  . Smokeless tobacco: Never Used  . Tobacco comment: twice a month- cigar  Substance Use Topics  . Alcohol use: Yes    Alcohol/week: 7.0 standard drinks    Types: 7 Glasses of wine per week  . Drug use: No     Medication list has been reviewed and updated.  Current Meds  Medication Sig  . albuterol (PROVENTIL HFA;VENTOLIN HFA) 108 (90 Base) MCG/ACT inhaler   . Ascorbic Acid (VITAMIN C) 1000 MG tablet Take 1,000 mg by mouth daily.  Marland Kitchen b complex vitamins tablet Take 1 tablet by mouth daily.  . Biotin 10 MG CAPS Take by mouth.  Marland Kitchen FLUoxetine (PROZAC) 20 MG tablet TAKE 1 TABLET DAILY  . fluticasone (FLONASE) 50 MCG/ACT nasal spray Place 2 sprays into both nostrils daily.  . Multiple Vitamins-Minerals (MULTIVITAMIN WITH MINERALS) tablet Take 1 tablet by mouth daily.  . Omega-3 Fatty Acids (FISH OIL) 1000 MG CAPS Take by mouth.  . valACYclovir (VALTREX) 1000 MG tablet Take 1,000 mg by mouth 2 (two) times daily as needed.  PHQ 2/9 Scores 12/25/2018 12/23/2017 08/19/2017 05/21/2017  PHQ - 2 Score 5 2 0 6  PHQ- 9 Score 10 3 0 17    BP Readings from Last 3 Encounters:  12/25/18 136/72  07/10/18 132/90  02/25/18 124/80    Physical Exam Vitals signs and nursing note reviewed.  Constitutional:      General: She is not in acute distress.    Appearance: She is well-developed.  HENT:     Head: Normocephalic and  atraumatic.     Right Ear: Tympanic membrane and ear canal normal.     Left Ear: Tympanic membrane and ear canal normal.     Nose:     Right Sinus: No maxillary sinus tenderness.     Left Sinus: No maxillary sinus tenderness.     Mouth/Throat:     Pharynx: Uvula midline.  Eyes:     General: No scleral icterus.       Right eye: No discharge.        Left eye: No discharge.     Conjunctiva/sclera: Conjunctivae normal.  Neck:     Musculoskeletal: Normal range of motion. No erythema.     Thyroid: No thyromegaly.     Vascular: No carotid bruit.  Cardiovascular:     Rate and Rhythm: Normal rate and regular rhythm.     Pulses: Normal pulses.     Heart sounds: Normal heart sounds.  Pulmonary:     Effort: Pulmonary effort is normal. No respiratory distress.     Breath sounds: No wheezing.  Chest:     Breasts:        Right: No mass, nipple discharge, skin change or tenderness.        Left: No mass, nipple discharge, skin change or tenderness.  Abdominal:     General: Bowel sounds are normal.     Palpations: Abdomen is soft.     Tenderness: There is no abdominal tenderness.  Musculoskeletal: Normal range of motion.  Lymphadenopathy:     Cervical: No cervical adenopathy.  Skin:    General: Skin is warm and dry.     Findings: No rash.  Neurological:     Mental Status: She is alert and oriented to person, place, and time.     Cranial Nerves: No cranial nerve deficit.     Sensory: No sensory deficit.     Deep Tendon Reflexes: Reflexes are normal and symmetric.  Psychiatric:        Speech: Speech normal.        Behavior: Behavior normal.        Thought Content: Thought content normal.     Wt Readings from Last 3 Encounters:  12/25/18 225 lb (102.1 kg)  07/10/18 215 lb (97.5 kg)  02/25/18 216 lb (98 kg)    BP 136/72   Pulse 68   Ht 5\' 6"  (1.676 m)   Wt 225 lb (102.1 kg)   SpO2 97%   BMI 36.32 kg/m   Assessment and Plan: 1. Annual physical exam Normal except for weight  Resume regular exercise - CBC with Differential/Platelet - Comprehensive metabolic panel - POCT urinalysis dipstick  2. Encounter for screening mammogram for breast cancer - MM 3D SCREEN BREAST BILATERAL; Future  3. Colon cancer screening - Fecal occult blood, imunochemical  4. Mixed hyperlipidemia - Lipid panel  5. Current moderate episode of major depressive disorder without prior episode ([facility]) Increase prozac to 30 mg daily - TSH  6. Herpes genitalis in women Continue episodic treatment -  valACYclovir (VALTREX) 1000 MG tablet; Take 1 tablet (1,000 mg total) by mouth 2 (two) times daily as needed.  Dispense: 30 tablet; Refill: 0  7. Perimenopausal symptoms Continue herbal supplements   Partially dictated using Animal nutritionistDragon software. Any errors are unintentional.  Bari EdwardLaura Kennadie Brenner, MD [facility]  12/25/2018

## 2018-12-26 LAB — COMPREHENSIVE METABOLIC PANEL
ALT: 33 IU/L — ABNORMAL HIGH (ref 0–32)
AST: 29 IU/L (ref 0–40)
Albumin/Globulin Ratio: 1.9 (ref 1.2–2.2)
Albumin: 4.5 g/dL (ref 3.8–4.9)
Alkaline Phosphatase: 72 IU/L (ref 39–117)
BUN/Creatinine Ratio: 15 (ref 9–23)
BUN: 14 mg/dL (ref 6–24)
Bilirubin Total: 0.3 mg/dL (ref 0.0–1.2)
CO2: 22 mmol/L (ref 20–29)
Calcium: 9.8 mg/dL (ref 8.7–10.2)
Chloride: 103 mmol/L (ref 96–106)
Creatinine, Ser: 0.91 mg/dL (ref 0.57–1.00)
GFR calc Af Amer: 84 mL/min/{1.73_m2} (ref >59)
GFR calc non Af Amer: 73 mL/min/{1.73_m2} (ref >59)
Globulin, Total: 2.4 g/dL (ref 1.5–4.5)
Glucose: 95 mg/dL (ref 65–99)
Potassium: 4.6 mmol/L (ref 3.5–5.2)
Sodium: 141 mmol/L (ref 134–144)
Total Protein: 6.9 g/dL (ref 6.0–8.5)

## 2018-12-26 LAB — CBC WITH DIFFERENTIAL/PLATELET
Basophils Absolute: 0.1 10*3/uL (ref 0.0–0.2)
Basos: 1 %
EOS (ABSOLUTE): 0.3 10*3/uL (ref 0.0–0.4)
Eos: 5 %
Hematocrit: 41.8 % (ref 34.0–46.6)
Hemoglobin: 14.1 g/dL (ref 11.1–15.9)
Immature Grans (Abs): 0 10*3/uL (ref 0.0–0.1)
Immature Granulocytes: 0 %
Lymphocytes Absolute: 1.9 10*3/uL (ref 0.7–3.1)
Lymphs: 30 %
MCH: 31.1 pg (ref 26.6–33.0)
MCHC: 33.7 g/dL (ref 31.5–35.7)
MCV: 92 fL (ref 79–97)
Monocytes Absolute: 0.5 10*3/uL (ref 0.1–0.9)
Monocytes: 8 %
Neutrophils Absolute: 3.5 10*3/uL (ref 1.4–7.0)
Neutrophils: 56 %
Platelets: 384 10*3/uL (ref 150–450)
RBC: 4.53 x10E6/uL (ref 3.77–5.28)
RDW: 12.4 % (ref 11.7–15.4)
WBC: 6.3 10*3/uL (ref 3.4–10.8)

## 2018-12-26 LAB — LIPID PANEL
Chol/HDL Ratio: 6.1 ratio — ABNORMAL HIGH (ref 0.0–4.4)
Cholesterol, Total: 307 mg/dL — ABNORMAL HIGH (ref 100–199)
HDL: 50 mg/dL (ref >39)
LDL Calculated: 188 mg/dL — ABNORMAL HIGH (ref 0–99)
Triglycerides: 347 mg/dL — ABNORMAL HIGH (ref 0–149)
VLDL Cholesterol Cal: 69 mg/dL — ABNORMAL HIGH (ref 5–40)

## 2018-12-26 LAB — TSH: TSH: 2.49 u[IU]/mL (ref 0.450–4.500)

## 2019-01-08 ENCOUNTER — Other Ambulatory Visit: Payer: Self-pay | Admitting: Internal Medicine

## 2019-01-08 DIAGNOSIS — A6009 Herpesviral infection of other urogenital tract: Secondary | ICD-10-CM

## 2019-01-21 ENCOUNTER — Other Ambulatory Visit: Payer: Self-pay

## 2019-01-21 ENCOUNTER — Ambulatory Visit
Admission: RE | Admit: 2019-01-21 | Discharge: 2019-01-21 | Disposition: A | Discharge status: Home / Self Care | Payer: BLUE CROSS/BLUE SHIELD | Source: Ambulatory Visit | Attending: Internal Medicine | Admitting: Internal Medicine

## 2019-01-21 DIAGNOSIS — Z1231 Encounter for screening mammogram for malignant neoplasm of breast: Secondary | ICD-10-CM | POA: Diagnosis not present

## 2019-01-24 ENCOUNTER — Other Ambulatory Visit: Payer: Self-pay | Admitting: Internal Medicine

## 2019-01-24 DIAGNOSIS — A6009 Herpesviral infection of other urogenital tract: Secondary | ICD-10-CM

## 2019-02-02 DIAGNOSIS — Z20828 Contact with and (suspected) exposure to other viral communicable diseases: Secondary | ICD-10-CM | POA: Diagnosis not present

## 2019-02-04 LAB — FECAL OCCULT BLOOD, IMMUNOCHEMICAL

## 2019-02-05 ENCOUNTER — Other Ambulatory Visit: Payer: Self-pay

## 2019-02-05 DIAGNOSIS — Z1211 Encounter for screening for malignant neoplasm of colon: Secondary | ICD-10-CM

## 2019-02-05 NOTE — Progress Notes (Signed)
Patient will pick up a new test kit from front desk next week.

## 2019-02-17 IMAGING — MG MM DIGITAL SCREENING BILAT W/ TOMO W/ CAD
6 of 10 series · 6 of 30 positions shown · non-contrast
Comparison: Previous exam(s).

CLINICAL DATA: Screening.

EXAM:
DIGITAL SCREENING BILATERAL MAMMOGRAM WITH TOMO AND CAD

[L CC synth-2D]
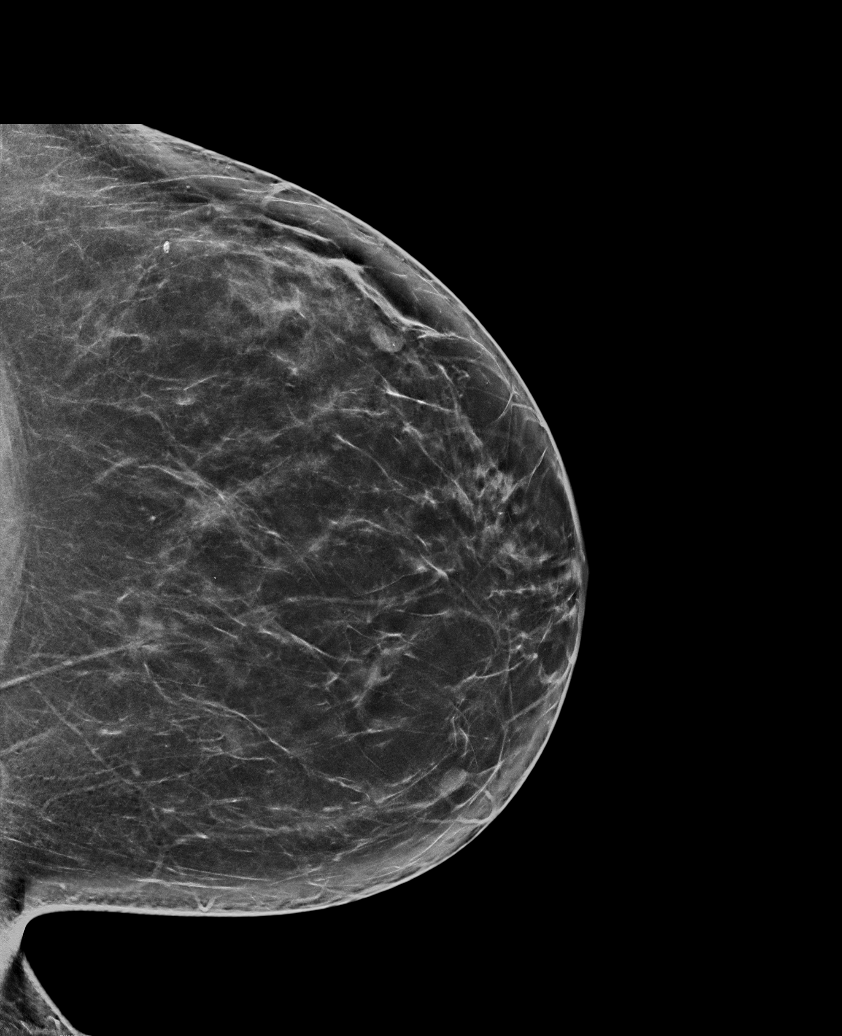

[L MLO synth-2D (1 of 2)]
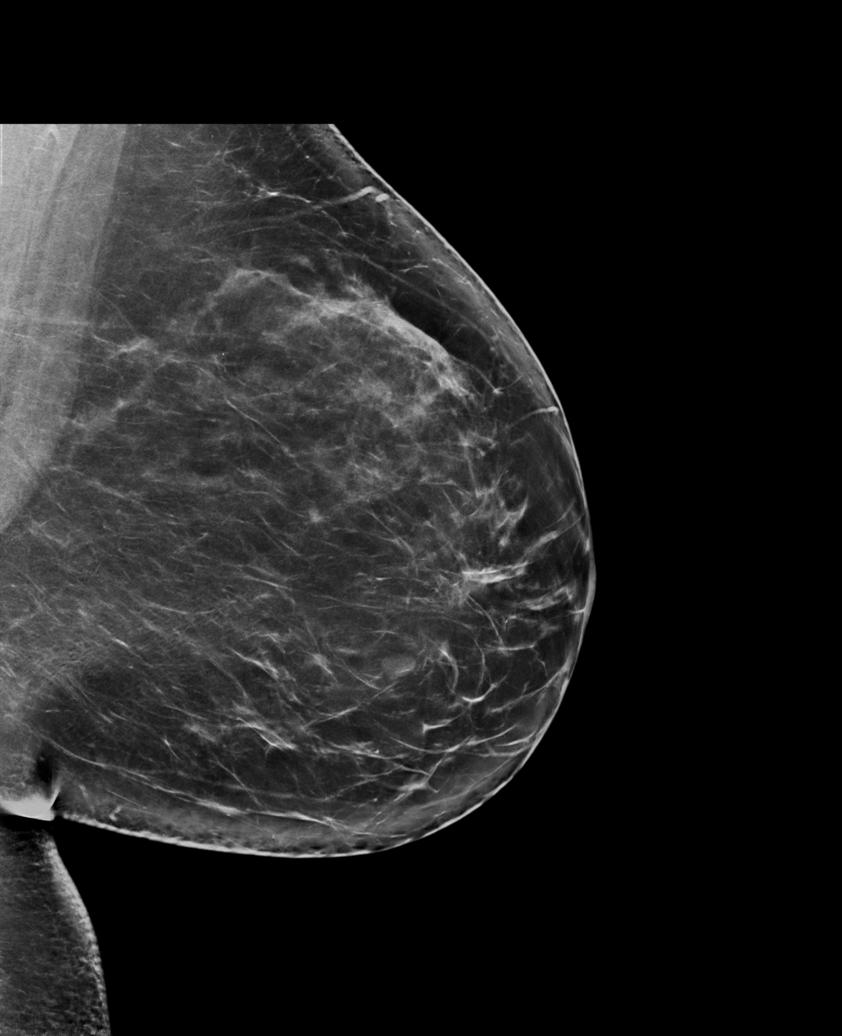

[R CC synth-2D]
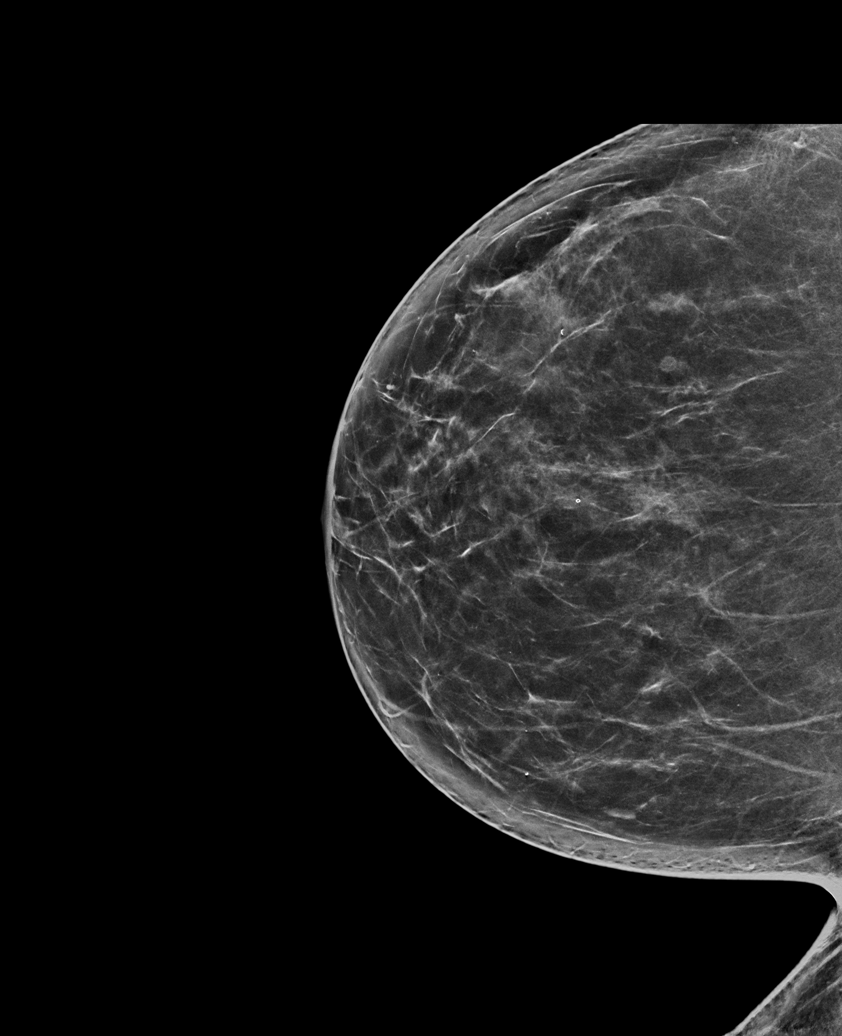

[R MLO synth-2D]
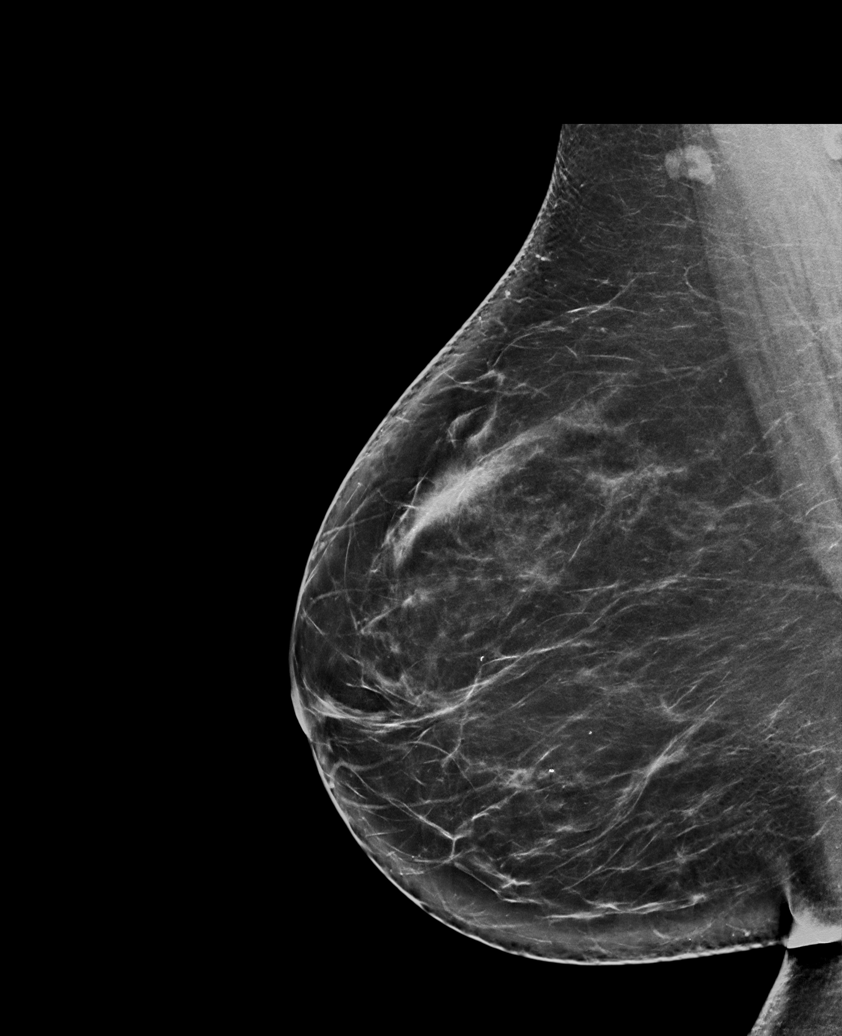

[L MLO synth-2D (2 of 2)]
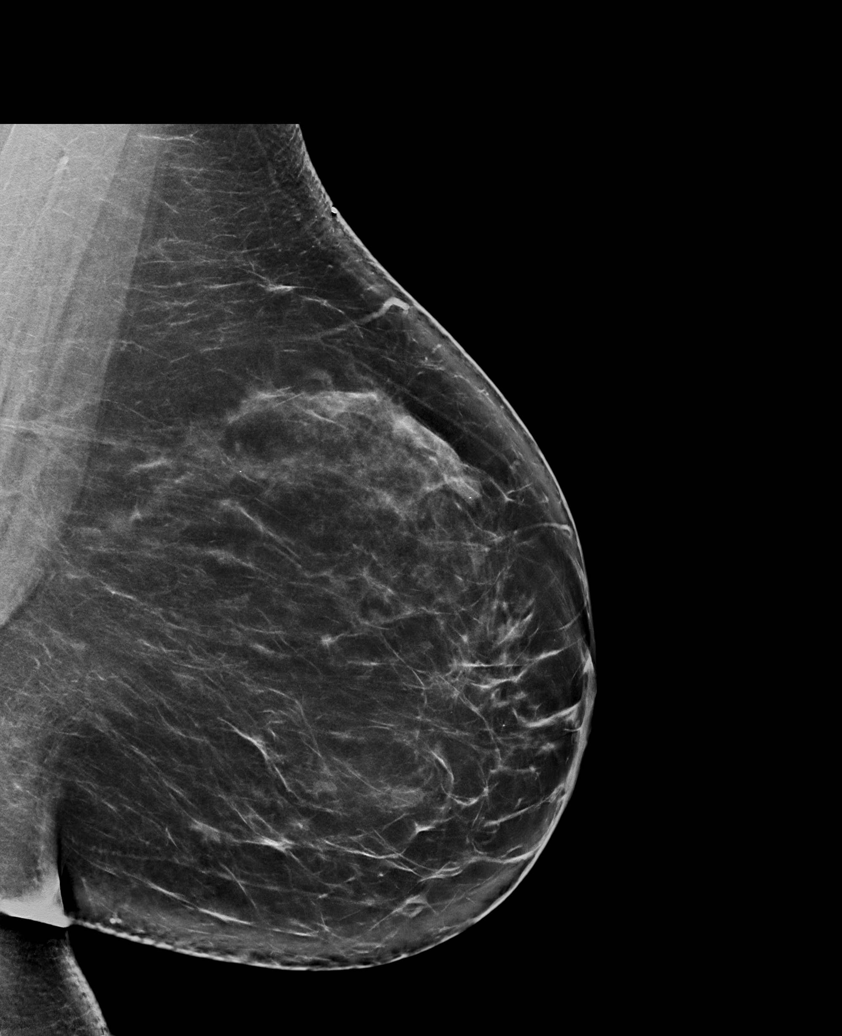

[L CC tomo · tomo slice 41/81.0]
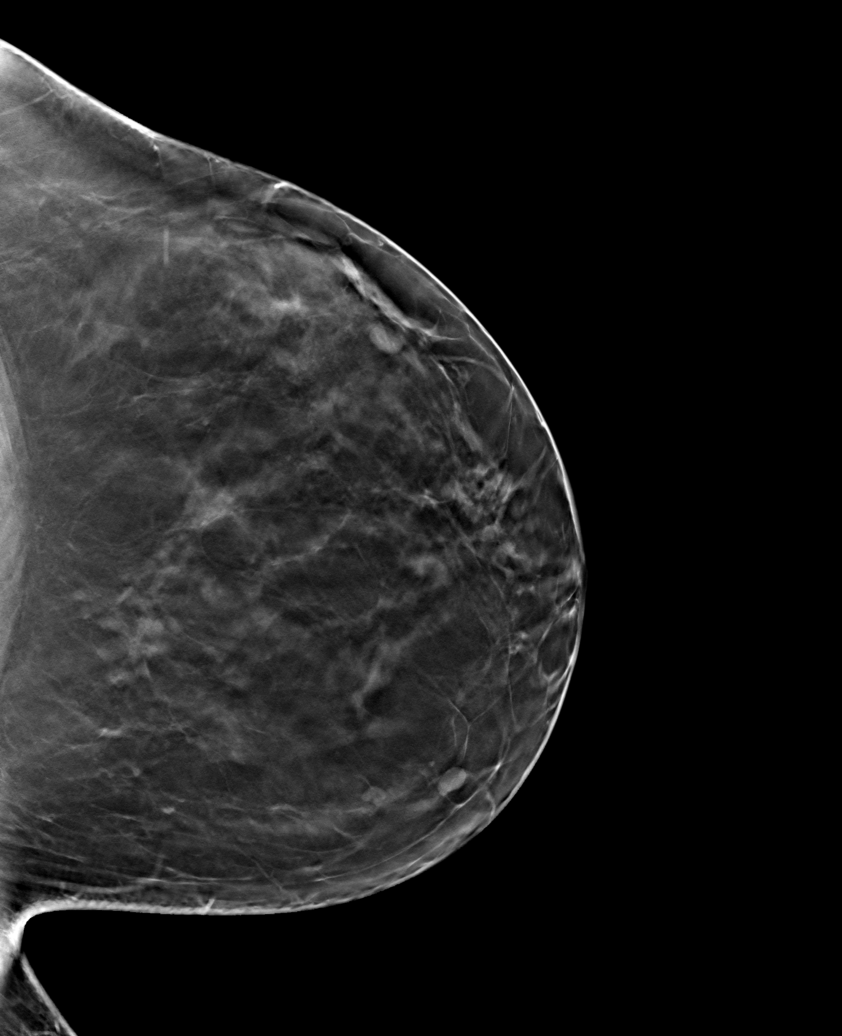

[6 of 30 positions shown; findings below may reference images not displayed]

ACR Breast Density Category b: There are scattered areas of
fibroglandular density.
FINDINGS: There are no findings suspicious for malignancy. Images were
processed with CAD.
IMPRESSION: No mammographic evidence of malignancy. A result letter of this
screening mammogram will be mailed directly to the patient.

RECOMMENDATION:
Screening mammogram in one year. (Code:[id number])

BI-RADS CATEGORY  1: Negative.

## 2019-12-24 ENCOUNTER — Telehealth: Payer: Self-pay | Admitting: Internal Medicine

## 2019-12-24 NOTE — Telephone Encounter (Signed)
Patient has canceled her 7/21 appt as she has moved to [place].

## 2019-12-29 ENCOUNTER — Encounter: Payer: BC Managed Care – PPO | Admitting: Internal Medicine

## 2022-08-31 ENCOUNTER — Encounter (HOSPITAL_COMMUNITY): Payer: Self-pay

## 2022-08-31 ENCOUNTER — Emergency Department (HOSPITAL_COMMUNITY)
Admission: EM | Admit: 2022-08-31 | Discharge: 2022-08-31 | Disposition: A | Discharge status: Home / Self Care | Payer: BC Managed Care – PPO | Attending: Emergency Medicine | Admitting: Emergency Medicine

## 2022-08-31 ENCOUNTER — Other Ambulatory Visit: Payer: Self-pay

## 2022-08-31 ENCOUNTER — Emergency Department (HOSPITAL_COMMUNITY): Payer: BC Managed Care – PPO

## 2022-08-31 DIAGNOSIS — K429 Umbilical hernia without obstruction or gangrene: Secondary | ICD-10-CM | POA: Diagnosis not present

## 2022-08-31 DIAGNOSIS — I10 Essential (primary) hypertension: Secondary | ICD-10-CM | POA: Diagnosis not present

## 2022-08-31 DIAGNOSIS — N83202 Unspecified ovarian cyst, left side: Secondary | ICD-10-CM | POA: Diagnosis not present

## 2022-08-31 DIAGNOSIS — K5730 Diverticulosis of large intestine without perforation or abscess without bleeding: Secondary | ICD-10-CM | POA: Diagnosis not present

## 2022-08-31 DIAGNOSIS — K5792 Diverticulitis of intestine, part unspecified, without perforation or abscess without bleeding: Secondary | ICD-10-CM | POA: Insufficient documentation

## 2022-08-31 DIAGNOSIS — D259 Leiomyoma of uterus, unspecified: Secondary | ICD-10-CM

## 2022-08-31 DIAGNOSIS — R1030 Lower abdominal pain, unspecified: Secondary | ICD-10-CM | POA: Diagnosis not present

## 2022-08-31 DIAGNOSIS — R110 Nausea: Secondary | ICD-10-CM | POA: Diagnosis not present

## 2022-08-31 DIAGNOSIS — R197 Diarrhea, unspecified: Secondary | ICD-10-CM | POA: Diagnosis not present

## 2022-08-31 LAB — COMPREHENSIVE METABOLIC PANEL
ALT: 25 U/L (ref 0–44)
AST: 25 U/L (ref 15–41)
Albumin: 4.8 g/dL (ref 3.5–5.0)
Alkaline Phosphatase: 66 U/L (ref 38–126)
Anion gap: 9 (ref 5–15)
BUN: 14 mg/dL (ref 6–20)
CO2: 25 mmol/L (ref 22–32)
Calcium: 10 mg/dL (ref 8.9–10.3)
Chloride: 105 mmol/L (ref 98–111)
Creatinine, Ser: 1.1 mg/dL — ABNORMAL HIGH (ref 0.44–1.00)
GFR, Estimated: 59 mL/min — ABNORMAL LOW (ref >60)
Glucose, Bld: 137 mg/dL — ABNORMAL HIGH (ref 70–99)
Potassium: 3.8 mmol/L (ref 3.5–5.1)
Sodium: 139 mmol/L (ref 135–145)
Total Bilirubin: 0.5 mg/dL (ref 0.3–1.2)
Total Protein: 8.2 g/dL — ABNORMAL HIGH (ref 6.5–8.1)

## 2022-08-31 LAB — CBC
HCT: 43.2 % (ref 36.0–46.0)
Hemoglobin: 14.5 g/dL (ref 12.0–15.0)
MCH: 31.7 pg (ref 26.0–34.0)
MCHC: 33.6 g/dL (ref 30.0–36.0)
MCV: 94.3 fL (ref 80.0–100.0)
Platelets: 378 10*3/uL (ref 150–400)
RBC: 4.58 MIL/uL (ref 3.87–5.11)
RDW: 12.5 % (ref 11.5–15.5)
WBC: 14.2 10*3/uL — ABNORMAL HIGH (ref 4.0–10.5)
nRBC: 0 % (ref 0.0–0.2)

## 2022-08-31 LAB — URINALYSIS, ROUTINE W REFLEX MICROSCOPIC
Bilirubin Urine: NEGATIVE
Glucose, UA: NEGATIVE mg/dL
Hgb urine dipstick: NEGATIVE
Ketones, ur: NEGATIVE mg/dL
Nitrite: NEGATIVE
Protein, ur: NEGATIVE mg/dL
Specific Gravity, Urine: 1.014 (ref 1.005–1.030)
pH: 6 (ref 5.0–8.0)

## 2022-08-31 LAB — PREGNANCY, URINE: Preg Test, Ur: NEGATIVE

## 2022-08-31 LAB — LIPASE, BLOOD: Lipase: 42 U/L (ref 11–51)

## 2022-08-31 MED ORDER — ONDANSETRON HCL 4 MG/2ML IJ SOLN
4.0000 mg | Freq: Once | INTRAMUSCULAR | Status: AC
  Administered 2022-08-31: 4 mg via INTRAVENOUS
  Filled 2022-08-31: qty 2

## 2022-08-31 MED ORDER — SODIUM CHLORIDE 0.9 % IV BOLUS
1000.0000 mL | Freq: Once | INTRAVENOUS | Status: AC
  Administered 2022-08-31: 1000 mL via INTRAVENOUS

## 2022-08-31 MED ORDER — ONDANSETRON 8 MG PO TBDP: 8.0000 mg | ORAL_TABLET | Freq: Three times a day (TID) | ORAL | 0 refills | Status: DC | PRN

## 2022-08-31 MED ORDER — PIPERACILLIN-TAZOBACTAM 3.375 G IVPB 30 MIN
3.3750 g | Freq: Once | INTRAVENOUS | Status: AC
  Administered 2022-08-31: 3.375 g via INTRAVENOUS
  Filled 2022-08-31: qty 50

## 2022-08-31 MED ORDER — TRAMADOL HCL 50 MG PO TABS: 50.0000 mg | ORAL_TABLET | Freq: Four times a day (QID) | ORAL | 0 refills | Status: DC | PRN

## 2022-08-31 MED ORDER — AMOXICILLIN-POT CLAVULANATE 875-125 MG PO TABS: 1.0000 | ORAL_TABLET | Freq: Two times a day (BID) | ORAL | 0 refills | Status: DC

## 2022-08-31 MED ORDER — HYDROMORPHONE HCL 1 MG/ML IJ SOLN
0.5000 mg | Freq: Once | INTRAMUSCULAR | Status: AC
  Administered 2022-08-31: 0.5 mg via INTRAVENOUS
  Filled 2022-08-31: qty 1

## 2022-08-31 MED ORDER — IOHEXOL 300 MG/ML  SOLN
100.0000 mL | Freq: Once | INTRAMUSCULAR | Status: AC | PRN
  Administered 2022-08-31: 100 mL via INTRAVENOUS

## 2022-08-31 NOTE — Discharge Instructions (Addendum)
It was our pleasure to provide your ER care today - we hope that you feel better. Drink plenty of fluids/stay well hydrated. Take antibiotic as prescribed.  Take acetaminophen or ibuprofen as need. You may also take ultram as need for pain - no driving when taking. Take zofran as need for nausea.   Your ct scan was read as showing diverticulitis. Incidental note was also made of a small umbilical hernia, as well as A 2.4 cm x 2.3 cm x 2.2 cm exophytic uterine fibroid is seen along the anterolateral aspect of the uterine fundus on the left. The right adnexa is unremarkable. A 3.1 cm diameter round,  mildly hyperdense (approximately 58.26 Hounsfield units) area is seen within the left adnexa (possible cyst).  For umbilical hernia, follow up with general surgeon. Call office Monday for appointment.   For fibroid/ovarian cyst - follow up wih ob/gyn doctor in the next 1-2 weeks. Call office Monday for appointment.   Return to ER if worse, new symptoms, high fevers, worsening or severe pain, persistent vomiting, or other concern.   You were given pain meds in the ER  -  no driving for the next 6 hours.

## 2022-08-31 NOTE — ED Provider Notes (Signed)
[place] EMERGENCY DEPARTMENT AT [facility] Provider Note   CSN: [id number] Arrival date & time: 08/31/22  1856     History  Chief Complaint  Patient presents with   Abdominal Pain   Diarrhea    Ashley Garcia is a 57 y.o. female.  Patient c/o lower abd pain in past 1-2 days. Symptoms acute onset, moderate, persistent, worse this evening. Non radiation. No back pain. No dysuria or hematuria. No vaginal discharge or bleeding. Remote hx diverticulitis, not sure if same. No recent abx use. Did have a couple loose bms today, no severe diarrhea. No fever or chills.   The history is provided by the patient and medical records.  Abdominal Pain Associated symptoms: diarrhea   Associated symptoms: no chest pain, no chills, no dysuria, no fever, no shortness of breath, no sore throat, no vaginal bleeding and no vaginal discharge   Diarrhea Associated symptoms: abdominal pain   Associated symptoms: no chills, no fever and no headaches        Home Medications Prior to Admission medications   Medication Sig Start Date End Date Taking? Authorizing Provider  amoxicillin-clavulanate (AUGMENTIN) 875-125 MG tablet Take 1 tablet by mouth every 12 (twelve) hours. 09/01/22  Yes Lajean Saver, MD  Ascorbic Acid (VITAMIN C) 1000 MG tablet Take 1,000 mg by mouth daily.   Yes [provider]  b complex vitamins tablet Take 1 tablet by mouth daily.   Yes [provider]  Biotin 10 MG CAPS Take 1 capsule by mouth daily.   Yes [provider]  dextromethorphan-guaiFENesin (MUCINEX DM) 30-600 MG 12hr tablet Take 1 tablet by mouth 2 (two) times daily as needed for cough.   Yes [provider]  FLUoxetine (PROZAC) 40 MG capsule Take 40 mg by mouth daily.   Yes [provider]  fluticasone (FLONASE) 50 MCG/ACT nasal spray Place 2 sprays into both nostrils daily. Patient taking differently: Place 2 sprays into both nostrils daily as needed for  allergies. 02/04/17  Yes Frederich Cha, MD  MELATONIN PO Take 1 tablet by mouth daily as needed (For sleep).   Yes [provider]  ondansetron (ZOFRAN-ODT) 8 MG disintegrating tablet Take 1 tablet (8 mg total) by mouth every 8 (eight) hours as needed for nausea or vomiting. 08/31/22  Yes Lajean Saver, MD  rosuvastatin (CRESTOR) 10 MG tablet Take 10 mg by mouth daily. 05/17/22  Yes [provider]  traMADol (ULTRAM) 50 MG tablet Take 1 tablet (50 mg total) by mouth every 6 (six) hours as needed. 08/31/22  Yes Lajean Saver, MD  valACYclovir (VALTREX) 1000 MG tablet TAKE 1 TABLET (1,000 MG TOTAL) BY MOUTH 2 (TWO) TIMES DAILY AS NEEDED. Patient taking differently: Take 1,000 mg by mouth 2 (two) times daily as needed (For outbreaks). 01/24/19  Yes Glean Hess, MD  FLUoxetine (PROZAC) 20 MG tablet TAKE 1 TABLET DAILY Patient not taking: Reported on 08/31/2022 07/15/18   Glean Hess, MD  Multiple Vitamins-Minerals (MULTIVITAMIN WITH MINERALS) tablet Take 1 tablet by mouth daily. Patient not taking: Reported on 08/31/2022    [provider]      Allergies    Patient has no known allergies.    Review of Systems   Review of Systems  Constitutional:  Negative for chills and fever.  HENT:  Negative for sore throat.   Eyes:  Negative for redness.  Respiratory:  Negative for shortness of breath.   Cardiovascular:  Negative for chest pain.  Gastrointestinal:  Positive for abdominal pain and diarrhea.  Genitourinary:  Negative for dysuria, flank pain, vaginal bleeding and vaginal discharge.  Musculoskeletal:  Negative for back pain and neck pain.  Skin:  Negative for rash.  Neurological:  Negative for headaches.  Hematological:  Does not bruise/bleed easily.  Psychiatric/Behavioral:  Negative for confusion.     Physical Exam Updated Vital Signs BP (!) 152/76   Pulse 92   Temp 97.8 F (36.6 C) (Oral)   Resp 17   Ht 1.676 m (5\' 6" )   Wt 100.7 kg   SpO2 100%    BMI 35.83 kg/m  Physical Exam Vitals and nursing note reviewed.  Constitutional:      Appearance: Normal appearance. She is well-developed.  HENT:     Head: Atraumatic.     Nose: Nose normal.     Mouth/Throat:     Mouth: Mucous membranes are moist.  Eyes:     General: No scleral icterus.    Conjunctiva/sclera: Conjunctivae normal.  Neck:     Trachea: No tracheal deviation.  Cardiovascular:     Rate and Rhythm: Normal rate and regular rhythm.     Pulses: Normal pulses.     Heart sounds: Normal heart sounds. No murmur heard.    No friction rub. No gallop.  Pulmonary:     Effort: Pulmonary effort is normal. No respiratory distress.     Breath sounds: Normal breath sounds.  Abdominal:     General: Bowel sounds are normal. There is no distension.     Palpations: Abdomen is soft.     Tenderness: There is abdominal tenderness. There is no guarding.     Comments: LLQ tenderness.   Genitourinary:    Comments: No cva tenderness.  Musculoskeletal:        General: No swelling.     Cervical back: Normal range of motion and neck supple. No rigidity. No muscular tenderness.  Skin:    General: Skin is warm and dry.     Findings: No rash.  Neurological:     Mental Status: She is alert.     Comments: Alert, speech normal.   Psychiatric:        Mood and Affect: Mood normal.     ED Results / Procedures / Treatments   Labs (all labs ordered are listed, but only abnormal results are displayed) Results for orders placed or performed during the hospital encounter of 08/31/22  CBC  Result Value Ref Range   WBC 14.2 (H) 4.0 - 10.5 K/uL   RBC 4.58 3.87 - 5.11 MIL/uL   Hemoglobin 14.5 12.0 - 15.0 g/dL   HCT 43.2 36.0 - 46.0 %   MCV 94.3 80.0 - 100.0 fL   MCH 31.7 26.0 - 34.0 pg   MCHC 33.6 30.0 - 36.0 g/dL   RDW 12.5 11.5 - 15.5 %   Platelets 378 150 - 400 K/uL   nRBC 0.0 0.0 - 0.2 %  Comprehensive metabolic panel  Result Value Ref Range   Sodium 139 135 - 145 mmol/L   Potassium  3.8 3.5 - 5.1 mmol/L   Chloride 105 98 - 111 mmol/L   CO2 25 22 - 32 mmol/L   Glucose, Bld 137 (H) 70 - 99 mg/dL   BUN 14 6 - 20 mg/dL   Creatinine, Ser 1.10 (H) 0.44 - 1.00 mg/dL   Calcium 10.0 8.9 - 10.3 mg/dL   Total Protein 8.2 (H) 6.5 - 8.1 g/dL   Albumin 4.8 3.5 - 5.0 g/dL  AST 25 15 - 41 U/L   ALT 25 0 - 44 U/L   Alkaline Phosphatase 66 38 - 126 U/L   Total Bilirubin 0.5 0.3 - 1.2 mg/dL   GFR, Estimated 59 (L) >60 mL/min   Anion gap 9 5 - 15  Urinalysis, Routine w reflex microscopic -Urine, Clean Catch  Result Value Ref Range   Color, Urine YELLOW YELLOW   APPearance HAZY (A) CLEAR   Specific Gravity, Urine 1.014 1.005 - 1.030   pH 6.0 5.0 - 8.0   Glucose, UA NEGATIVE NEGATIVE mg/dL   Hgb urine dipstick NEGATIVE NEGATIVE   Bilirubin Urine NEGATIVE NEGATIVE   Ketones, ur NEGATIVE NEGATIVE mg/dL   Protein, ur NEGATIVE NEGATIVE mg/dL   Nitrite NEGATIVE NEGATIVE   Leukocytes,Ua TRACE (A) NEGATIVE   RBC / HPF 0-5 0 - 5 RBC/hpf   WBC, UA 11-20 0 - 5 WBC/hpf   Bacteria, UA RARE (A) NONE SEEN   Squamous Epithelial / HPF 0-5 0 - 5 /HPF   Mucus PRESENT    Hyaline Casts, UA PRESENT   Pregnancy, urine  Result Value Ref Range   Preg Test, Ur NEGATIVE NEGATIVE  Lipase, blood  Result Value Ref Range   Lipase 42 11 - 51 U/L      EKG None  Radiology CT Abdomen Pelvis W Contrast  Result Date: 08/31/2022 CLINICAL DATA:  Abdominal pain. EXAM: CT ABDOMEN AND PELVIS WITH CONTRAST TECHNIQUE: Multidetector CT imaging of the abdomen and pelvis was performed using the standard protocol following bolus administration of intravenous contrast. RADIATION DOSE REDUCTION: This exam was performed according to the departmental dose-optimization program which includes automated exposure control, adjustment of the mA and/or kV according to patient size and/or use of iterative reconstruction technique. CONTRAST:  164mL OMNIPAQUE IOHEXOL 300 MG/ML  SOLN COMPARISON:  None Available. FINDINGS:  Lower chest: No acute abnormality. Hepatobiliary: No focal liver abnormality is seen. No gallstones, gallbladder wall thickening, or biliary dilatation. Pancreas: Unremarkable. No pancreatic ductal dilatation or surrounding inflammatory changes. Spleen: Normal in size without focal abnormality. Adrenals/Urinary Tract: Adrenal glands are unremarkable. Kidneys are normal in size, without renal calculi or hydronephrosis. A 13 mm diameter simple cyst is seen within the anterior aspect of the mid left kidney. Bladder is unremarkable. Stomach/Bowel: Stomach is within normal limits. Appendix appears normal. No evidence of bowel dilatation. Mildly inflamed diverticula are seen within the mid sigmoid colon. Noninflamed diverticula are noted within the distal transverse and descending colon. Vascular/Lymphatic: Aortic atherosclerosis. No enlarged abdominal or pelvic lymph nodes. Reproductive: A 2.4 cm x 2.3 cm x 2.2 cm exophytic uterine fibroid is seen along the anterolateral aspect of the uterine fundus on the left. The right adnexa is unremarkable. A 3.1 cm diameter round, mildly hyperdense (approximately 58.26 Hounsfield units) area is seen within the left adnexa (axial CT images 67 through 74, CT series 2). Other: A 13 mm x 13 mm x 12 mm fat containing umbilical hernia is noted. No abdominopelvic ascites. Musculoskeletal: Multilevel degenerative changes seen throughout the lumbar spine. This is most prominent at the level of L5-S1. IMPRESSION: 1. Mild sigmoid diverticulitis. 2. Findings likely consistent with a partially hemorrhagic left ovarian cyst. Correlation with pelvic ultrasound is recommended. 3. Small exophytic uterine fibroid. 4. Aortic atherosclerosis. 5. 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 Aortic Atherosclerosis (ICD10-I70.0). Electronically Signed   By: Virgina Norfolk M.D.   On: 08/31/2022 20:59    Procedures Procedures    Medications  Ordered in ED Medications  HYDROmorphone (DILAUDID) injection 0.5 mg (0.5 mg Intravenous Given 08/31/22 1939)  ondansetron (ZOFRAN) injection 4 mg (4 mg Intravenous Given 08/31/22 1936)  sodium chloride 0.9 % bolus 1,000 mL (0 mLs Intravenous Stopped 08/31/22 2209)  iohexol (OMNIPAQUE) 300 MG/ML solution 100 mL (100 mLs Intravenous Contrast Given 08/31/22 2032)  piperacillin-tazobactam (ZOSYN) IVPB 3.375 g (3.375 g Intravenous New Bag/Given 08/31/22 2148)    ED Course/ Medical Decision Making/ A&P                             Medical Decision Making Problems Addressed: Acute diverticulitis: acute illness or injury with systemic symptoms that poses a threat to life or bodily functions Left ovarian cyst: acute illness or injury Umbilical hernia without obstruction and without gangrene: chronic illness or injury Uterine leiomyoma, unspecified location: chronic illness or injury  Amount and/or Complexity of Data Reviewed External Data Reviewed: notes. Labs: ordered. Decision-making details documented in ED Course. Radiology: ordered and independent interpretation performed. Decision-making details documented in ED Course.  Risk Prescription drug management. Parenteral controlled substances. Decision regarding hospitalization.   Iv ns. Continuous pulse ox and cardiac monitoring. Labs ordered/sent. Imaging ordered.   Differential diagnosis includes diverticulitis, acute diarrheal illness, pyelo, etc. Dispo decision including potential need for admission considered - will get labs and imaging and reassess.   Reviewed nursing notes and prior charts for additional history. External reports reviewed.   Cardiac monitor: sinus rhythm, rate 80.  Dilaudid iv, zofran iv, ns bolus iv.   Labs reviewed/interpreted by me - wbc elev.    CT reviewed/interpreted by me - diverticulitis - pt hx and exam findings, and ct c/w diverticulitis. No incarcerated umbil hernia. No pelvic  pain/tenderness.  Zosyn iv. Rx augmentin for home. Also discussed various incidental findings on ct and need for close outpatient follow up.   Pt tolerating po.   Pt appears stable for d/c.  Rec pcp and gen surg f/u as outpatient.   Return precautions provided.              Final Clinical Impression(s) / ED Diagnoses Final diagnoses:  Acute diverticulitis  Umbilical hernia without obstruction and without gangrene  Uterine leiomyoma, unspecified location  Left ovarian cyst    Rx / DC Orders ED Discharge Orders          Ordered    amoxicillin-clavulanate (AUGMENTIN) 875-125 MG tablet  Every 12 hours        08/31/22 2207    traMADol (ULTRAM) 50 MG tablet  Every 6 hours PRN        08/31/22 2207    ondansetron (ZOFRAN-ODT) 8 MG disintegrating tablet  Every 8 hours PRN        08/31/22 2207              Lajean Saver, MD 08/31/22 2225

## 2022-08-31 NOTE — ED Triage Notes (Signed)
Abdominal pain and diarrhea, initially started yesterday, woke up with diarrhea, this afternoon symptoms worsen,no temp, lower abdominal.

## 2022-09-16 DIAGNOSIS — R7309 Other abnormal glucose: Secondary | ICD-10-CM | POA: Diagnosis not present

## 2022-09-18 ENCOUNTER — Ambulatory Visit (INDEPENDENT_AMBULATORY_CARE_PROVIDER_SITE_OTHER): Payer: BC Managed Care – PPO | Admitting: Nurse Practitioner

## 2022-09-18 ENCOUNTER — Encounter: Payer: Self-pay | Admitting: Nurse Practitioner

## 2022-09-18 VITALS — BP 122/71 | HR 76 | Temp 97.3°F | Ht 66.5 in | Wt 221.6 lb

## 2022-09-18 DIAGNOSIS — Z1329 Encounter for screening for other suspected endocrine disorder: Secondary | ICD-10-CM

## 2022-09-18 DIAGNOSIS — Z1231 Encounter for screening mammogram for malignant neoplasm of breast: Secondary | ICD-10-CM

## 2022-09-18 DIAGNOSIS — E559 Vitamin D deficiency, unspecified: Secondary | ICD-10-CM | POA: Diagnosis not present

## 2022-09-18 DIAGNOSIS — E538 Deficiency of other specified B group vitamins: Secondary | ICD-10-CM

## 2022-09-18 DIAGNOSIS — D259 Leiomyoma of uterus, unspecified: Secondary | ICD-10-CM

## 2022-09-18 DIAGNOSIS — J302 Other seasonal allergic rhinitis: Secondary | ICD-10-CM

## 2022-09-18 DIAGNOSIS — Z0000 Encounter for general adult medical examination without abnormal findings: Secondary | ICD-10-CM

## 2022-09-18 DIAGNOSIS — K429 Umbilical hernia without obstruction or gangrene: Secondary | ICD-10-CM | POA: Diagnosis not present

## 2022-09-18 DIAGNOSIS — N83202 Unspecified ovarian cyst, left side: Secondary | ICD-10-CM | POA: Diagnosis not present

## 2022-09-18 MED ORDER — FEXOFENADINE HCL 180 MG PO TABS
180.0000 mg | ORAL_TABLET | Freq: Every day | ORAL | 0 refills | Status: DC
Start: 2022-09-18 — End: 2022-11-18

## 2022-09-18 MED ORDER — BENZONATATE 200 MG PO CAPS
200.0000 mg | ORAL_CAPSULE | Freq: Two times a day (BID) | ORAL | 0 refills | Status: DC | PRN
Start: 2022-09-18 — End: 2023-01-02

## 2022-09-18 NOTE — Assessment & Plan Note (Signed)
1. Uterine leiomyoma, unspecified location  - Ambulatory referral to Obstetrics / Gynecology   2. Cyst of left ovary  - Ambulatory referral to Obstetrics / Gynecology   3. Umbilical hernia without obstruction and without gangrene  - Ambulatory referral to General Surgery   4. Encounter for screening mammogram for malignant neoplasm of breast  - MM Digital Screening; Future   Follow up:  Follow up in 3 months

## 2022-09-18 NOTE — Patient Instructions (Addendum)
1. Uterine leiomyoma, unspecified location  - Ambulatory referral to Obstetrics / Gynecology   2. Cyst of left ovary  - Ambulatory referral to Obstetrics / Gynecology   3. Umbilical hernia without obstruction and without gangrene  - Ambulatory referral to General Surgery   4. Encounter for screening mammogram for malignant neoplasm of breast  - MM Digital Screening; Future   Follow up:  Follow up in 3 months

## 2022-09-18 NOTE — Progress Notes (Signed)
@Patient  ID: Ashley Garcia, female    DOB: 1966-05-15, 57 y.o.   MRN: [id number]  Chief Complaint  Patient presents with   Hospitalization Follow-up    Referring provider: No ref. provider found   HPI  Patient presents today to establish care and for ED follow-up.  She was seen in the ED on 08/31/2022 and was diagnosed with acute diverticulitis.  She was treated with IV antibiotics and sent home with Augmentin.  She states she is much improved.  Patient will need referrals today for incidental finding of uterine fibroid, ovarian cyst umbilical hernia on CT of abdomen.  Patient states that she has been having a lot of fatigue recently. Denies f/c/s, n/v/d, hemoptysis, PND, leg swelling Denies chest pain or edema       No Known Allergies  Immunization History  Administered Date(s) Administered   PFIZER(Purple Top)SARS-COV-2 Vaccination 09/11/2019, 10/02/2019    Past Medical History:  Diagnosis Date   Allergy     Tobacco History: Social History   Tobacco Use  Smoking Status Light Smoker   Types: Cigars  Smokeless Tobacco Never  Tobacco Comments   twice a month- cigar   Ready to quit: Not Answered Counseling given: Not Answered Tobacco comments: twice a month- cigar   Outpatient Encounter Medications as of 09/18/2022  Medication Sig   Ascorbic Acid (VITAMIN C) 1000 MG tablet Take 1,000 mg by mouth daily.   benzonatate (TESSALON) 200 MG capsule Take 1 capsule (200 mg total) by mouth 2 (two) times daily as needed for cough.   fexofenadine (ALLEGRA ALLERGY) 180 MG tablet Take 1 tablet (180 mg total) by mouth daily.   fluticasone (FLONASE) 50 MCG/ACT nasal spray Place 2 sprays into both nostrils daily. (Patient taking differently: Place 2 sprays into both nostrils daily as needed for allergies.)   MELATONIN PO Take 1 tablet by mouth daily as needed (For sleep).   rosuvastatin (CRESTOR) 10 MG tablet Take 10 mg by mouth daily.   traMADol (ULTRAM) 50 MG tablet Take 1  tablet (50 mg total) by mouth every 6 (six) hours as needed.   valACYclovir (VALTREX) 1000 MG tablet TAKE 1 TABLET (1,000 MG TOTAL) BY MOUTH 2 (TWO) TIMES DAILY AS NEEDED. (Patient taking differently: Take 1,000 mg by mouth 2 (two) times daily as needed (For outbreaks).)   amoxicillin-clavulanate (AUGMENTIN) 875-125 MG tablet Take 1 tablet by mouth every 12 (twelve) hours. (Patient not taking: Reported on 09/18/2022)   b complex vitamins tablet Take 1 tablet by mouth daily. (Patient not taking: Reported on 09/18/2022)   Biotin 10 MG CAPS Take 1 capsule by mouth daily. (Patient not taking: Reported on 09/18/2022)   dextromethorphan-guaiFENesin (MUCINEX DM) 30-600 MG 12hr tablet Take 1 tablet by mouth 2 (two) times daily as needed for cough. (Patient not taking: Reported on 09/18/2022)   FLUoxetine (PROZAC) 20 MG tablet TAKE 1 TABLET DAILY (Patient not taking: Reported on 08/31/2022)   FLUoxetine (PROZAC) 40 MG capsule Take 40 mg by mouth daily. (Patient not taking: Reported on 09/18/2022)   Multiple Vitamins-Minerals (MULTIVITAMIN WITH MINERALS) tablet Take 1 tablet by mouth daily. (Patient not taking: Reported on 08/31/2022)   ondansetron (ZOFRAN-ODT) 8 MG disintegrating tablet Take 1 tablet (8 mg total) by mouth every 8 (eight) hours as needed for nausea or vomiting. (Patient not taking: Reported on 09/18/2022)   No facility-administered encounter medications on file as of 09/18/2022.     Review of Systems  Review of Systems  Constitutional: Negative.   HENT:  Negative.    Cardiovascular: Negative.   Gastrointestinal: Negative.   Allergic/Immunologic: Negative.   Neurological: Negative.   Psychiatric/Behavioral: Negative.         Physical Exam  BP 122/71   Pulse 76   Temp (!) 97.3 F (36.3 C)   Ht 5' 6.5" (1.689 m)   Wt 221 lb 9.6 oz (100.5 kg)   SpO2 96%   BMI 35.23 kg/m   Wt Readings from Last 5 Encounters:  09/18/22 221 lb 9.6 oz (100.5 kg)  08/31/22 222 lb (100.7 kg)  12/25/18 225 lb  (102.1 kg)  07/10/18 215 lb (97.5 kg)  02/25/18 216 lb (98 kg)     Physical Exam Vitals and nursing note reviewed.  Constitutional:      General: She is not in acute distress.    Appearance: She is well-developed.  Cardiovascular:     Rate and Rhythm: Normal rate and regular rhythm.  Pulmonary:     Effort: Pulmonary effort is normal.     Breath sounds: Normal breath sounds.  Neurological:     Mental Status: She is alert and oriented to person, place, and time.  Psychiatric:        Mood and Affect: Mood normal.        Behavior: Behavior normal.       Assessment & Plan:   Uterine leiomyoma 1. Uterine leiomyoma, unspecified location  - Ambulatory referral to Obstetrics / Gynecology   2. Cyst of left ovary  - Ambulatory referral to Obstetrics / Gynecology   3. Umbilical hernia without obstruction and without gangrene  - Ambulatory referral to General Surgery   4. Encounter for screening mammogram for malignant neoplasm of breast  - MM Digital Screening; Future   Follow up:  Follow up in 3 months     Fenton Foy, NP 09/18/2022

## 2022-09-19 LAB — COMPREHENSIVE METABOLIC PANEL
ALT: 19 IU/L (ref 0–32)
AST: 14 IU/L (ref 0–40)
Albumin/Globulin Ratio: 1.5 (ref 1.2–2.2)
Albumin: 4 g/dL (ref 3.8–4.9)
Alkaline Phosphatase: 78 IU/L (ref 44–121)
BUN/Creatinine Ratio: 19 (ref 9–23)
BUN: 17 mg/dL (ref 6–24)
Bilirubin Total: <0.2 mg/dL (ref 0.0–1.2)
CO2: 21 mmol/L (ref 20–29)
Calcium: 9.5 mg/dL (ref 8.7–10.2)
Chloride: 105 mmol/L (ref 96–106)
Creatinine, Ser: 0.88 mg/dL (ref 0.57–1.00)
Globulin, Total: 2.7 g/dL (ref 1.5–4.5)
Glucose: 89 mg/dL (ref 70–99)
Potassium: 4.5 mmol/L (ref 3.5–5.2)
Sodium: 142 mmol/L (ref 134–144)
Total Protein: 6.7 g/dL (ref 6.0–8.5)
eGFR: 78 mL/min/{1.73_m2} (ref >59)

## 2022-09-19 LAB — CBC
Hematocrit: 37.7 % (ref 34.0–46.6)
Hemoglobin: 12.8 g/dL (ref 11.1–15.9)
MCH: 31.1 pg (ref 26.6–33.0)
MCHC: 34 g/dL (ref 31.5–35.7)
MCV: 92 fL (ref 79–97)
Platelets: 505 10*3/uL — ABNORMAL HIGH (ref 150–450)
RBC: 4.12 x10E6/uL (ref 3.77–5.28)
RDW: 12 % (ref 11.7–15.4)
WBC: 6.6 10*3/uL (ref 3.4–10.8)

## 2022-09-19 LAB — TSH: TSH: 1.5 u[IU]/mL (ref 0.450–4.500)

## 2022-09-19 LAB — VITAMIN B12: Vitamin B-12: 584 pg/mL (ref 232–1245)

## 2022-09-19 LAB — VITAMIN D 25 HYDROXY (VIT D DEFICIENCY, FRACTURES): Vit D, 25-Hydroxy: 26.2 ng/mL — ABNORMAL LOW (ref 30.0–100.0)

## 2022-10-11 ENCOUNTER — Ambulatory Visit: Payer: BC Managed Care – PPO | Admitting: Nurse Practitioner

## 2022-10-15 DIAGNOSIS — K429 Umbilical hernia without obstruction or gangrene: Secondary | ICD-10-CM | POA: Diagnosis not present

## 2022-10-16 DIAGNOSIS — R7309 Other abnormal glucose: Secondary | ICD-10-CM | POA: Diagnosis not present

## 2022-10-18 ENCOUNTER — Ambulatory Visit: Payer: BLUE CROSS/BLUE SHIELD | Admitting: Nurse Practitioner

## 2022-11-01 ENCOUNTER — Ambulatory Visit
Admission: RE | Admit: 2022-11-01 | Discharge: 2022-11-01 | Disposition: A | Discharge status: Home / Self Care | Payer: BC Managed Care – PPO | Source: Ambulatory Visit | Attending: Nurse Practitioner | Admitting: Nurse Practitioner

## 2022-11-01 DIAGNOSIS — Z1231 Encounter for screening mammogram for malignant neoplasm of breast: Secondary | ICD-10-CM

## 2022-11-16 DIAGNOSIS — R7309 Other abnormal glucose: Secondary | ICD-10-CM | POA: Diagnosis not present

## 2022-11-18 ENCOUNTER — Other Ambulatory Visit: Payer: Self-pay | Admitting: Nurse Practitioner

## 2022-11-18 DIAGNOSIS — J302 Other seasonal allergic rhinitis: Secondary | ICD-10-CM

## 2022-12-05 ENCOUNTER — Encounter: Payer: Self-pay | Admitting: Nurse Practitioner

## 2022-12-05 ENCOUNTER — Ambulatory Visit (INDEPENDENT_AMBULATORY_CARE_PROVIDER_SITE_OTHER): Payer: BC Managed Care – PPO | Admitting: Nurse Practitioner

## 2022-12-05 VITALS — BP 123/78 | HR 85 | Temp 97.2°F | Ht 66.0 in | Wt 222.2 lb

## 2022-12-05 DIAGNOSIS — N83201 Unspecified ovarian cyst, right side: Secondary | ICD-10-CM | POA: Diagnosis not present

## 2022-12-05 DIAGNOSIS — Z1211 Encounter for screening for malignant neoplasm of colon: Secondary | ICD-10-CM

## 2022-12-05 DIAGNOSIS — Z6835 Body mass index (BMI) 35.0-35.9, adult: Secondary | ICD-10-CM

## 2022-12-05 DIAGNOSIS — M25562 Pain in left knee: Secondary | ICD-10-CM

## 2022-12-05 DIAGNOSIS — E6609 Other obesity due to excess calories: Secondary | ICD-10-CM | POA: Diagnosis not present

## 2022-12-05 DIAGNOSIS — N83202 Unspecified ovarian cyst, left side: Secondary | ICD-10-CM

## 2022-12-05 MED ORDER — ROSUVASTATIN CALCIUM 10 MG PO TABS: 10.0000 mg | ORAL_TABLET | Freq: Every day | ORAL | 2 refills | Status: DC

## 2022-12-05 MED ORDER — WEGOVY 0.25 MG/0.5ML ~~LOC~~ SOAJ
0.2500 mg | SUBCUTANEOUS | 2 refills | Status: DC
Start: 2022-12-05 — End: 2023-01-09

## 2022-12-05 MED ORDER — PREDNISONE 20 MG PO TABS
20.0000 mg | ORAL_TABLET | Freq: Every day | ORAL | 0 refills | Status: AC
Start: 2022-12-05 — End: 2022-12-10

## 2022-12-05 NOTE — Patient Instructions (Signed)
1. Colon cancer screening  - Ambulatory referral to Gastroenterology   2. Cysts of both ovaries  - Ambulatory referral to Obstetrics / Gynecology   3. Acute pain of left knee  - DG Knee Complete 4 Views Left - predniSONE (DELTASONE) 20 MG tablet; Take 1 tablet (20 mg total) by mouth daily with breakfast for 5 days.  Dispense: 5 tablet; Refill: 0   4. Class 2 obesity due to excess calories without serious comorbidity with body mass index (BMI) of 35.0 to 35.9 in adult  - Semaglutide-Weight Management (WEGOVY) 0.25 MG/0.5ML SOAJ; Inject 0.25 mg into the skin once a week.  Dispense: 2 mL; Refill: 2   Follow up:  Follow up in 3 months

## 2022-12-05 NOTE — Progress Notes (Addendum)
@Patient  ID: Ashley Garcia, female    DOB: 05-Aug-1965, 57 y.o.   MRN: [id number]  Chief Complaint  Patient presents with   Knee Pain    Left no known injury    Referring provider: Ivonne Andrew, NP   HPI  Patient presents today for an acute visit.  She states that she has been having left knee pain for the past 2 months.  She denies any injury to that knee.  She states that she feels it more when kneeling down.  We will order prednisone and order x-ray of the knee.  Patient would also like to start her on [facility] for weight loss.  She states that her insurance will cover this.  We will try to order this for her to see if it will cover. Patient has attempted to lose weight within the last 6 months via calorie restriction, increased activity and behavioral modifications. She states that this has not helped.   Patient would like for [place] to place a new order to OB/GYN.  She was found to have cyst on her ovaries a few months ago and a referral was placed but she never was able to get an appointment.  Patient is due for colon cancer screening.  Patient has a past history of diverticulitis and we will place a referral to GI for colonoscopy.  Denies f/c/s, n/v/d, hemoptysis, PND, leg swelling Denies chest pain or edema   Left knee pain  2 months         No Known Allergies  Immunization History  Administered Date(s) Administered   PFIZER(Purple Top)SARS-COV-2 Vaccination 09/11/2019, 10/02/2019    Past Medical History:  Diagnosis Date   Allergy     Tobacco History: Social History   Tobacco Use  Smoking Status Light Smoker   Types: Cigars  Smokeless Tobacco Never  Tobacco Comments   twice a month- cigar   Ready to quit: Not Answered Counseling given: Not Answered Tobacco comments: twice a month- cigar   Outpatient Encounter Medications as of 12/05/2022  Medication Sig   predniSONE (DELTASONE) 20 MG tablet Take 1 tablet (20 mg total) by mouth daily with  breakfast for 5 days.   Semaglutide-Weight Management (WEGOVY) 0.25 MG/0.5ML SOAJ Inject 0.25 mg into the skin once a week.   amoxicillin-clavulanate (AUGMENTIN) 875-125 MG tablet Take 1 tablet by mouth every 12 (twelve) hours. (Patient not taking: Reported on 09/18/2022)   Ascorbic Acid (VITAMIN C) 1000 MG tablet Take 1,000 mg by mouth daily.   b complex vitamins tablet Take 1 tablet by mouth daily. (Patient not taking: Reported on 09/18/2022)   benzonatate (TESSALON) 200 MG capsule Take 1 capsule (200 mg total) by mouth 2 (two) times daily as needed for cough.   Biotin 10 MG CAPS Take 1 capsule by mouth daily. (Patient not taking: Reported on 09/18/2022)   dextromethorphan-guaiFENesin (MUCINEX DM) 30-600 MG 12hr tablet Take 1 tablet by mouth 2 (two) times daily as needed for cough. (Patient not taking: Reported on 09/18/2022)   fexofenadine (ALLEGRA) 180 MG tablet TAKE 1 TABLET BY MOUTH EVERY DAY   FLUoxetine (PROZAC) 20 MG tablet TAKE 1 TABLET DAILY (Patient not taking: Reported on 08/31/2022)   FLUoxetine (PROZAC) 40 MG capsule Take 40 mg by mouth daily. (Patient not taking: Reported on 09/18/2022)   fluticasone (FLONASE) 50 MCG/ACT nasal spray Place 2 sprays into both nostrils daily. (Patient taking differently: Place 2 sprays into both nostrils daily as needed for allergies.)   MELATONIN PO Take  1 tablet by mouth daily as needed (For sleep).   Multiple Vitamins-Minerals (MULTIVITAMIN WITH MINERALS) tablet Take 1 tablet by mouth daily. (Patient not taking: Reported on 08/31/2022)   ondansetron (ZOFRAN-ODT) 8 MG disintegrating tablet Take 1 tablet (8 mg total) by mouth every 8 (eight) hours as needed for nausea or vomiting. (Patient not taking: Reported on 09/18/2022)   rosuvastatin (CRESTOR) 10 MG tablet Take 1 tablet (10 mg total) by mouth daily.   traMADol (ULTRAM) 50 MG tablet Take 1 tablet (50 mg total) by mouth every 6 (six) hours as needed.   valACYclovir (VALTREX) 1000 MG tablet TAKE 1 TABLET (1,000  MG TOTAL) BY MOUTH 2 (TWO) TIMES DAILY AS NEEDED. (Patient taking differently: Take 1,000 mg by mouth 2 (two) times daily as needed (For outbreaks).)   [DISCONTINUED] rosuvastatin (CRESTOR) 10 MG tablet Take 10 mg by mouth daily.   No facility-administered encounter medications on file as of 12/05/2022.     Review of Systems  Review of Systems  Constitutional: Negative.   HENT: Negative.    Cardiovascular: Negative.   Gastrointestinal: Negative.   Musculoskeletal:        Left knee pain  Allergic/Immunologic: Negative.   Neurological: Negative.   Psychiatric/Behavioral: Negative.         Physical Exam  BP 123/78   Pulse 85   Temp (!) 97.2 F (36.2 C)   Ht 5\' 6"  (1.676 m)   Wt 222 lb 3.2 oz (100.8 kg)   SpO2 98%   BMI 35.86 kg/m   Wt Readings from Last 5 Encounters:  12/05/22 222 lb 3.2 oz (100.8 kg)  09/18/22 221 lb 9.6 oz (100.5 kg)  08/31/22 222 lb (100.7 kg)  12/25/18 225 lb (102.1 kg)  07/10/18 215 lb (97.5 kg)     Physical Exam Vitals and nursing note reviewed.  Constitutional:      General: She is not in acute distress.    Appearance: She is well-developed.  Cardiovascular:     Rate and Rhythm: Normal rate and regular rhythm.  Pulmonary:     Effort: Pulmonary effort is normal.     Breath sounds: Normal breath sounds.  Musculoskeletal:     Left knee: Decreased range of motion. Tenderness present.  Neurological:     Mental Status: She is alert and oriented to person, place, and time.      Lab Results:  CBC    Component Value Date/Time   WBC 6.6 09/18/2022 1525   WBC 14.2 (H) 08/31/2022 1933   RBC 4.12 09/18/2022 1525   RBC 4.58 08/31/2022 1933   HGB 12.8 09/18/2022 1525   HCT 37.7 09/18/2022 1525   PLT 505 (H) 09/18/2022 1525   MCV 92 09/18/2022 1525   MCH 31.1 09/18/2022 1525   MCH 31.7 08/31/2022 1933   MCHC 34.0 09/18/2022 1525   MCHC 33.6 08/31/2022 1933   RDW 12.0 09/18/2022 1525   LYMPHSABS 1.9 12/25/2018 0901   EOSABS 0.3  12/25/2018 0901   BASOSABS 0.1 12/25/2018 0901    BMET    Component Value Date/Time   NA 142 09/18/2022 1525   K 4.5 09/18/2022 1525   CL 105 09/18/2022 1525   CO2 21 09/18/2022 1525   GLUCOSE 89 09/18/2022 1525   GLUCOSE 137 (H) 08/31/2022 1933   BUN 17 09/18/2022 1525   CREATININE 0.88 09/18/2022 1525   CALCIUM 9.5 09/18/2022 1525   GFRNONAA 59 (L) 08/31/2022 1933   GFRAA 84 12/25/2018 0901     Assessment &  Plan:   Colon cancer screening - Ambulatory referral to Gastroenterology   2. Cysts of both ovaries  - Ambulatory referral to Obstetrics / Gynecology   3. Acute pain of left knee  - DG Knee Complete 4 Views Left - predniSONE (DELTASONE) 20 MG tablet; Take 1 tablet (20 mg total) by mouth daily with breakfast for 5 days.  Dispense: 5 tablet; Refill: 0   4. Class 2 obesity due to excess calories without serious comorbidity with body mass index (BMI) of 35.0 to 35.9 in adult  - Semaglutide-Weight Management (WEGOVY) 0.25 MG/0.5ML SOAJ; Inject 0.25 mg into the skin once a week.  Dispense: 2 mL; Refill: 2   Follow up:  Follow up in 3 months     Ivonne Andrew, NP 12/06/2022

## 2022-12-06 ENCOUNTER — Telehealth: Payer: Self-pay

## 2022-12-06 DIAGNOSIS — Z1211 Encounter for screening for malignant neoplasm of colon: Secondary | ICD-10-CM | POA: Insufficient documentation

## 2022-12-06 NOTE — Assessment & Plan Note (Signed)
-   Ambulatory referral to Gastroenterology   2. Cysts of both ovaries  - Ambulatory referral to Obstetrics / Gynecology   3. Acute pain of left knee  - DG Knee Complete 4 Views Left - predniSONE (DELTASONE) 20 MG tablet; Take 1 tablet (20 mg total) by mouth daily with breakfast for 5 days.  Dispense: 5 tablet; Refill: 0   4. Class 2 obesity due to excess calories without serious comorbidity with body mass index (BMI) of 35.0 to 35.9 in adult  - Semaglutide-Weight Management (WEGOVY) 0.25 MG/0.5ML SOAJ; Inject 0.25 mg into the skin once a week.  Dispense: 2 mL; Refill: 2   Follow up:  Follow up in 3 months

## 2022-12-06 NOTE — Telephone Encounter (Signed)
Submission complete, waiting on decision from insurance.

## 2022-12-06 NOTE — Telephone Encounter (Signed)
Received a prior authorization request for [facility] via CoverMyMeds Key: [id number], waiting on response back from MD before completing. Must have documentation that patient has tried to lose weight and was unsuccessful in a 103-month period: Was the patient able to achieve acceptable weight loss through lifestyle modifications (defined as 1lb per week weight loss through calorie restriction, increased activity, and behavioral modifications in a 48-month period)? Documentation will need to be submitted to insurance.

## 2022-12-09 ENCOUNTER — Encounter: Payer: Self-pay | Admitting: Gastroenterology

## 2022-12-09 ENCOUNTER — Other Ambulatory Visit: Payer: Self-pay

## 2022-12-09 NOTE — Telephone Encounter (Signed)
Approved until 04/11/2023. [facility] notified and were able to process prescription through insurance. MyChart message sent to patient today.

## 2022-12-13 ENCOUNTER — Ambulatory Visit (HOSPITAL_COMMUNITY)
Admission: RE | Admit: 2022-12-13 | Discharge: 2022-12-13 | Disposition: A | Discharge status: Home / Self Care | Payer: BC Managed Care – PPO | Source: Ambulatory Visit | Attending: Nurse Practitioner | Admitting: Nurse Practitioner

## 2022-12-13 DIAGNOSIS — M25562 Pain in left knee: Secondary | ICD-10-CM | POA: Diagnosis not present

## 2022-12-16 DIAGNOSIS — R7309 Other abnormal glucose: Secondary | ICD-10-CM | POA: Diagnosis not present

## 2022-12-18 DIAGNOSIS — E663 Overweight: Secondary | ICD-10-CM | POA: Insufficient documentation

## 2022-12-18 DIAGNOSIS — B009 Herpesviral infection, unspecified: Secondary | ICD-10-CM | POA: Insufficient documentation

## 2023-01-02 ENCOUNTER — Ambulatory Visit (AMBULATORY_SURGERY_CENTER): Payer: BC Managed Care – PPO | Admitting: *Deleted

## 2023-01-02 ENCOUNTER — Encounter: Payer: Self-pay | Admitting: Gastroenterology

## 2023-01-02 VITALS — Ht 66.5 in | Wt 216.0 lb

## 2023-01-02 DIAGNOSIS — Z1211 Encounter for screening for malignant neoplasm of colon: Secondary | ICD-10-CM

## 2023-01-02 MED ORDER — NA SULFATE-K SULFATE-MG SULF 17.5-3.13-1.6 GM/177ML PO SOLN: 1.0000 | Freq: Once | ORAL | 0 refills | Status: AC

## 2023-01-02 NOTE — Progress Notes (Signed)
  Pre visit completed over telephone. Instructions forwarded through MyChart. Patient instructions to hold [facility] 7 days prior to colonoscopy.    No egg or soy allergy known to patient  No issues known to pt with past sedation with any surgeries or procedures Patient denies ever being told they had issues or difficulty with intubation  No FH of Malignant Hyperthermia Pt is not on diet pills Pt is not on  home 02  Pt is not on blood thinners  Pt denies issues with constipation  No A fib or A flutter Have any cardiac testing pending-- NO Pt instructed to use [email or web address] or GoodRx for a price reduction on prep

## 2023-01-09 ENCOUNTER — Encounter: Payer: Self-pay | Admitting: Nurse Practitioner

## 2023-01-09 ENCOUNTER — Ambulatory Visit (INDEPENDENT_AMBULATORY_CARE_PROVIDER_SITE_OTHER): Payer: BC Managed Care – PPO | Admitting: Nurse Practitioner

## 2023-01-09 VITALS — BP 120/61 | HR 82 | Temp 97.5°F | Wt 219.0 lb

## 2023-01-09 DIAGNOSIS — E6609 Other obesity due to excess calories: Secondary | ICD-10-CM | POA: Diagnosis not present

## 2023-01-09 DIAGNOSIS — Z6835 Body mass index (BMI) 35.0-35.9, adult: Secondary | ICD-10-CM

## 2023-01-09 MED ORDER — WEGOVY 0.5 MG/0.5ML ~~LOC~~ SOAJ
0.5000 mg | SUBCUTANEOUS | 2 refills | Status: DC
Start: 2023-01-09 — End: 2023-03-24

## 2023-01-09 NOTE — Patient Instructions (Signed)
1. Class 2 obesity due to excess calories without serious comorbidity with body mass index (BMI) of 35.0 to 35.9 in adult  - Semaglutide-Weight Management (WEGOVY) 0.5 MG/0.5ML SOAJ; Inject 0.5 mg into the skin once a week.  Dispense: 2 mL; Refill: 2 - CBC - Comprehensive metabolic panel   Follow up:  Follow up in 3 months

## 2023-01-09 NOTE — Assessment & Plan Note (Signed)
-   Semaglutide-Weight Management (WEGOVY) 0.5 MG/0.5ML SOAJ; Inject 0.5 mg into the skin once a week.  Dispense: 2 mL; Refill: 2 - CBC - Comprehensive metabolic panel   Follow up:  Follow up in 3 months

## 2023-01-09 NOTE — Progress Notes (Signed)
@Patient  ID: Ashley Garcia, female    DOB: [birth year], 57 y.o.   MRN: [id number]  Chief Complaint  Patient presents with   Weight Loss    Was just started on wegovy     Referring provider: Ivonne Andrew, NP   HPI   Patient presents today for weight loss visit.  She has lost around 4 pounds since her last visit here a month ago.  She was started on 0.25 mg of Wegovy weekly.  We will increase this to 0.5 mg weekly today. Denies f/c/s, n/v/d, hemoptysis, PND, leg swelling Denies chest pain or edema      No Known Allergies  Immunization History  Administered Date(s) Administered   PFIZER(Purple Top)SARS-COV-2 Vaccination 09/11/2019, 10/02/2019    Past Medical History:  Diagnosis Date   Allergy    Depression    Hyperlipidemia     Tobacco History: Social History   Tobacco Use  Smoking Status Light Smoker   Types: Cigars  Smokeless Tobacco Never  Tobacco Comments   twice a month- cigar   Ready to quit: Not Answered Counseling given: Not Answered Tobacco comments: twice a month- cigar   Outpatient Encounter Medications as of 01/09/2023  Medication Sig   Semaglutide-Weight Management (WEGOVY) 0.5 MG/0.5ML SOAJ Inject 0.5 mg into the skin once a week.   Ascorbic Acid (VITAMIN C) 1000 MG tablet Take 1,000 mg by mouth daily.   B Complex Vitamins (VITAMIN-B COMPLEX PO)    b complex vitamins tablet Take 1 tablet by mouth daily.   Biotin 10 MG CAPS Take 1 capsule by mouth daily.   Cholecalciferol (VITAMIN D3) 50 MCG (2000 UT) TABS    fexofenadine (ALLEGRA) 180 MG tablet TAKE 1 TABLET BY MOUTH EVERY DAY   FLUoxetine (PROZAC) 40 MG capsule Take 40 mg by mouth daily.   MELATONIN PO Take 1 tablet by mouth daily as needed (For sleep).   rosuvastatin (CRESTOR) 10 MG tablet Take 1 tablet (10 mg total) by mouth daily.   Turmeric (QC TUMERIC COMPLEX) 500 MG CAPS    [DISCONTINUED] Semaglutide-Weight Management (WEGOVY) 0.25 MG/0.5ML SOAJ Inject 0.25 mg into the skin once  a week.   No facility-administered encounter medications on file as of 01/09/2023.     Review of Systems  Review of Systems  Constitutional: Negative.   HENT: Negative.    Cardiovascular: Negative.   Gastrointestinal: Negative.   Allergic/Immunologic: Negative.   Neurological: Negative.   Psychiatric/Behavioral: Negative.         Physical Exam  BP 120/61   Pulse 82   Temp (!) 97.5 F (36.4 C)   Wt 219 lb (99.3 kg)   SpO2 97%   BMI 34.82 kg/m   Wt Readings from Last 5 Encounters:  01/09/23 219 lb (99.3 kg)  01/02/23 216 lb (98 kg)  12/05/22 222 lb 3.2 oz (100.8 kg)  09/18/22 221 lb 9.6 oz (100.5 kg)  08/31/22 222 lb (100.7 kg)     Physical Exam Vitals and nursing note reviewed.  Constitutional:      General: She is not in acute distress.    Appearance: She is well-developed.  Cardiovascular:     Rate and Rhythm: Normal rate and regular rhythm.  Pulmonary:     Effort: Pulmonary effort is normal.     Breath sounds: Normal breath sounds.  Neurological:     Mental Status: She is alert and oriented to person, place, and time.      Lab Results:  CBC  Component Value Date/Time   WBC 6.6 09/18/2022 1525   WBC 14.2 (H) 08/31/2022 1933   RBC 4.12 09/18/2022 1525   RBC 4.58 08/31/2022 1933   HGB 12.8 09/18/2022 1525   HCT 37.7 09/18/2022 1525   PLT 505 (H) 09/18/2022 1525   MCV 92 09/18/2022 1525   MCH 31.1 09/18/2022 1525   MCH 31.7 08/31/2022 1933   MCHC 34.0 09/18/2022 1525   MCHC 33.6 08/31/2022 1933   RDW 12.0 09/18/2022 1525   LYMPHSABS 1.9 12/25/2018 0901   EOSABS 0.3 12/25/2018 0901   BASOSABS 0.1 12/25/2018 0901    BMET    Component Value Date/Time   NA 142 09/18/2022 1525   K 4.5 09/18/2022 1525   CL 105 09/18/2022 1525   CO2 21 09/18/2022 1525   GLUCOSE 89 09/18/2022 1525   GLUCOSE 137 (H) 08/31/2022 1933   BUN 17 09/18/2022 1525   CREATININE 0.88 09/18/2022 1525   CALCIUM 9.5 09/18/2022 1525   GFRNONAA 59 (L) 08/31/2022 1933    GFRAA 84 12/25/2018 0901    BNP No results found for: "BNP"  ProBNP No results found for: "PROBNP"  Imaging: DG Knee Complete 4 Views Left  Result Date: 12/13/2022 CLINICAL DATA:  Left knee pain for 3 months EXAM: LEFT KNEE - COMPLETE 4+ VIEW COMPARISON:  None Available. FINDINGS: No evidence of fracture, dislocation, or joint effusion. No evidence of significant arthropathy or other focal bone abnormality. Soft tissues are unremarkable. IMPRESSION: Negative. Electronically Signed   By: Duanne Guess D.O.   On: 12/13/2022 08:41     Assessment & Plan:   Class 2 obesity due to excess calories without serious comorbidity with body mass index (BMI) of 35.0 to 35.9 in adult - Semaglutide-Weight Management (WEGOVY) 0.5 MG/0.5ML SOAJ; Inject 0.5 mg into the skin once a week.  Dispense: 2 mL; Refill: 2 - CBC - Comprehensive metabolic panel   Follow up:  Follow up in 3 months     Ivonne Andrew, NP 01/09/2023

## 2023-01-10 LAB — COMPREHENSIVE METABOLIC PANEL: Potassium: 4.2 mmol/L (ref 3.5–5.2)

## 2023-01-16 DIAGNOSIS — R7309 Other abnormal glucose: Secondary | ICD-10-CM | POA: Diagnosis not present

## 2023-01-20 ENCOUNTER — Encounter: Payer: BC Managed Care – PPO | Admitting: Obstetrics & Gynecology

## 2023-01-23 ENCOUNTER — Other Ambulatory Visit: Payer: Self-pay | Admitting: Gastroenterology

## 2023-01-23 ENCOUNTER — Ambulatory Visit (AMBULATORY_SURGERY_CENTER): Payer: BC Managed Care – PPO | Admitting: Gastroenterology

## 2023-01-23 ENCOUNTER — Encounter: Payer: Self-pay | Admitting: Gastroenterology

## 2023-01-23 VITALS — BP 128/80 | HR 80 | Temp 97.5°F | Resp 15 | Ht 66.0 in | Wt 222.0 lb

## 2023-01-23 DIAGNOSIS — Z1211 Encounter for screening for malignant neoplasm of colon: Secondary | ICD-10-CM | POA: Diagnosis not present

## 2023-01-23 DIAGNOSIS — D128 Benign neoplasm of rectum: Secondary | ICD-10-CM | POA: Diagnosis not present

## 2023-01-23 DIAGNOSIS — K621 Rectal polyp: Secondary | ICD-10-CM | POA: Diagnosis not present

## 2023-01-23 MED ORDER — SODIUM CHLORIDE 0.9 % IV SOLN: 500.0000 mL | Freq: Once | INTRAVENOUS | Status: DC

## 2023-01-23 NOTE — Patient Instructions (Signed)
Handout on polyps , high fiber diet and diverticulosis given. Resume previous diet and continue present medications.     YOU HAD AN ENDOSCOPIC PROCEDURE TODAY AT THE [facility]:   Refer to the procedure report that was given to you for any specific questions about what was found during the examination.  If the procedure report does not answer your questions, please call your gastroenterologist to clarify.  If you requested that your care partner not be given the details of your procedure findings, then the procedure report has been included in a sealed envelope for you to review at your convenience later.  YOU SHOULD EXPECT: Some feelings of bloating in the abdomen. Passage of more gas than usual.  Walking can help get rid of the air that was put into your GI tract during the procedure and reduce the bloating. If you had a lower endoscopy (such as a colonoscopy or flexible sigmoidoscopy) you may notice spotting of blood in your stool or on the toilet paper. If you underwent a bowel prep for your procedure, you may not have a normal bowel movement for a few days.  Please Note:  You might notice some irritation and congestion in your nose or some drainage.  This is from the oxygen used during your procedure.  There is no need for concern and it should clear up in a day or so.  SYMPTOMS TO REPORT IMMEDIATELY:  Following lower endoscopy (colonoscopy or flexible sigmoidoscopy):  Excessive amounts of blood in the stool  Significant tenderness or worsening of abdominal pains  Swelling of the abdomen that is new, acute  Fever of 100F or higher  For urgent or emergent issues, a gastroenterologist can be reached at any hour by calling [phone]. Do not use MyChart messaging for urgent concerns.    DIET:  We do recommend a small meal at first, but then you may proceed to your regular diet.  Drink plenty of fluids but you should avoid alcoholic beverages for 24 hours.  ACTIVITY:  You  should plan to take it easy for the rest of today and you should NOT DRIVE or use heavy machinery until tomorrow (because of the sedation medicines used during the test).    FOLLOW UP: Our staff will call the number listed on your records the next business day following your procedure.  We will call around 7:15- 8:00 am to check on you and address any questions or concerns that you may have regarding the information given to you following your procedure. If we do not reach you, we will leave a message.     If any biopsies were taken you will be contacted by phone or by letter within the next 1-3 weeks.  Please call us at [phone] if you have not heard about the biopsies in 3 weeks.    SIGNATURES/CONFIDENTIALITY: You and/or your care partner have signed paperwork which will be entered into your electronic medical record.  These signatures attest to the fact that that the information above on your After Visit Summary has been reviewed and is understood.  Full responsibility of the confidentiality of this discharge information lies with you and/or your care-partner.

## 2023-01-23 NOTE — Op Note (Signed)
[facility] Patient Name: Ashley Garcia Procedure Date: 01/23/2023 7:33 AM MRN: [id number] Endoscopist: Lorin Picket E. Tomasa Rand , MD, [id number] Age: 57 Referring MD:  Date of Birth: [birth year] Gender: Female Account #: [id number] Procedure:                Colonoscopy Indications:              Screening for colorectal malignant neoplasm, This                            is the patient's first colonoscopy Medicines:                Monitored Anesthesia Care Procedure:                Pre-Anesthesia Assessment:                           - Prior to the procedure, a History and Physical                            was performed, and patient medications and                            allergies were reviewed. The patient's tolerance of                            previous anesthesia was also reviewed. The risks                            and benefits of the procedure and the sedation                            options and risks were discussed with the patient.                            All questions were answered, and informed consent                            was obtained. Prior Anticoagulants: The patient has                            taken no anticoagulant or antiplatelet agents. ASA                            Grade Assessment: II - A patient with mild systemic                            disease. After reviewing the risks and benefits,                            the patient was deemed in satisfactory condition to                            undergo the procedure.  After obtaining informed consent, the colonoscope                            was passed under direct vision. Throughout the                            procedure, the patient's blood pressure, pulse, and                            oxygen saturations were monitored continuously. The                            CF [id number] #[id number] was introduced through the anus                            and advanced to  the the terminal ileum, with                            identification of the appendiceal orifice and IC                            valve. The colonoscopy was performed without                            difficulty. The patient tolerated the procedure                            well. The quality of the bowel preparation was                            good. The terminal ileum, ileocecal valve,                            appendiceal orifice, and rectum were photographed.                            The bowel preparation used was SUPREP via split                            dose instruction. Scope In: 8:03:32 AM Scope Out: 8:20:16 AM Scope Withdrawal Time: 0 hours 9 minutes 38 seconds  Total Procedure Duration: 0 hours 16 minutes 44 seconds  Findings:                 Skin tags were found on perianal exam.                           The digital rectal exam was normal. Pertinent                            negatives include normal sphincter tone and no                            palpable rectal lesions.  A 3 mm polyp was found in the rectum. The polyp was                            sessile. The polyp was removed with a cold snare.                            Resection and retrieval were complete. Estimated                            blood loss was minimal.                           Many large-mouthed and small-mouthed diverticula                            were found in the sigmoid colon and descending                            colon. There was narrowing of the colon in                            association with the diverticular opening.                            Peri-diverticular erythema was seen.                           The exam was otherwise normal throughout the                            examined colon.                           The terminal ileum appeared normal.                           The retroflexed view of the distal rectum and anal                             verge was normal and showed no anal or rectal                            abnormalities. Complications:            No immediate complications. Estimated Blood Loss:     Estimated blood loss was minimal. Impression:               - Perianal skin tags found on perianal exam.                           - One 3 mm polyp in the rectum, removed with a cold                            snare. Resected and retrieved.                           -  Severe diverticulosis in the sigmoid colon and in                            the descending colon. There was narrowing of the                            colon in association with the diverticular opening.                            Peri-diverticular erythema was seen.                           - The examined portion of the ileum was normal.                           - The distal rectum and anal verge are normal on                            retroflexion view. Recommendation:           - Patient has a contact number available for                            emergencies. The signs and symptoms of potential                            delayed complications were discussed with the                            patient. Return to normal activities tomorrow.                            Written discharge instructions were provided to the                            patient.                           - Resume previous diet.                           - Continue present medications.                           - Await pathology results.                           - Repeat colonoscopy (date not yet determined) for                            surveillance based on pathology results.                           - Recommend high fiber diet or daily fiber  supplementation to reduce risk of diverticular                            complications. Saliah Crisp E. Tomasa Rand, MD 01/23/2023 8:29:40 AM This report has been signed electronically.

## 2023-01-23 NOTE — Progress Notes (Signed)
Pt's states no medical or surgical changes since previsit or office visit. 

## 2023-01-23 NOTE — Progress Notes (Signed)
Uneventful anesthetic. Report to pacu rn. Vss. Care resumed by rn. 

## 2023-01-23 NOTE — Progress Notes (Signed)
[facility] History and Physical   Primary Care Physician:  Ivonne Andrew, NP   Reason for Procedure:   Colon cancer screening  Plan:    Screening colonoscopy     HPI: Ashley Garcia is a 57 y.o. female undergoing initial average risk screening colonoscopy.  She has no family history of colon cancer and no chronic GI symptoms. She had an episode of uncomplicated diverticulitis in March of this year.  This was her first episode and she recovered quickly.   Past Medical History:  Diagnosis Date   Allergy    Depression    Hyperlipidemia     Past Surgical History:  Procedure Laterality Date   hip replacment Left    age 45   KNEE ARTHROSCOPY W/ MENISCECTOMY Right    PERCUTANEOUS PINNING METATARSAL FRACTURE      Prior to Admission medications   Medication Sig Start Date End Date Taking? Authorizing Provider  Ascorbic Acid (VITAMIN C) 1000 MG tablet Take 1,000 mg by mouth daily.   Yes [provider]  B Complex Vitamins (VITAMIN-B COMPLEX PO)    Yes [provider]  Biotin 10 MG CAPS Take 1 capsule by mouth daily.   Yes [provider]  Cholecalciferol (VITAMIN D3) 50 MCG (2000 UT) TABS    Yes [provider]  fexofenadine (ALLEGRA) 180 MG tablet TAKE 1 TABLET BY MOUTH EVERY DAY 11/18/22  Yes Ivonne Andrew, NP  MELATONIN PO Take 1 tablet by mouth daily as needed (For sleep).   Yes [provider]  rosuvastatin (CRESTOR) 10 MG tablet Take 1 tablet (10 mg total) by mouth daily. 12/05/22  Yes Ivonne Andrew, NP  Turmeric (QC TUMERIC COMPLEX) 500 MG CAPS    Yes [provider]  FLUoxetine (PROZAC) 40 MG capsule Take 40 mg by mouth daily.    [provider]  Semaglutide-Weight Management (WEGOVY) 0.5 MG/0.5ML SOAJ Inject 0.5 mg into the skin once a week. 01/09/23   Ivonne Andrew, NP    Current Outpatient Medications  Medication Sig Dispense Refill   Ascorbic Acid (VITAMIN C) 1000 MG tablet Take  1,000 mg by mouth daily.     B Complex Vitamins (VITAMIN-B COMPLEX PO)      Biotin 10 MG CAPS Take 1 capsule by mouth daily.     Cholecalciferol (VITAMIN D3) 50 MCG (2000 UT) TABS      fexofenadine (ALLEGRA) 180 MG tablet TAKE 1 TABLET BY MOUTH EVERY DAY 90 tablet 0   MELATONIN PO Take 1 tablet by mouth daily as needed (For sleep).     rosuvastatin (CRESTOR) 10 MG tablet Take 1 tablet (10 mg total) by mouth daily. 30 tablet 2   Turmeric (QC TUMERIC COMPLEX) 500 MG CAPS      FLUoxetine (PROZAC) 40 MG capsule Take 40 mg by mouth daily.     Semaglutide-Weight Management (WEGOVY) 0.5 MG/0.5ML SOAJ Inject 0.5 mg into the skin once a week. 2 mL 2   Current Facility-Administered Medications  Medication Dose Route Frequency Provider Last Rate Last Admin   0.9 %  sodium chloride infusion  500 mL Intravenous Once Jenel Lucks, MD        Allergies as of 01/23/2023   (No Known Allergies)    Family History  Problem Relation Age of Onset   Hypertension Mother    Liver cancer Father    Breast cancer Paternal Aunt 72   Colon cancer Neg Hx    Colon polyps Neg  Hx    Stomach cancer Neg Hx    Rectal cancer Neg Hx    Esophageal cancer Neg Hx     Social History   Socioeconomic History   Marital status: Single    Spouse name: Not on file   Number of children: Not on file   Years of education: Not on file   Highest education level: Bachelor's degree (e.g., BA, AB, BS)  Occupational History   Not on file  Tobacco Use   Smoking status: Former    Types: Cigars    Quit date: 2018    Years since quitting: 6.6   Smokeless tobacco: Never   Tobacco comments:    twice a month- cigar  Vaping Use   Vaping status: Never Used  Substance and Sexual Activity   Alcohol use: Yes    Alcohol/week: 7.0 standard drinks of alcohol    Types: 7 Glasses of wine per week   Drug use: No   Sexual activity: Yes  Other Topics Concern   Not on file  Social History Narrative   Not on file   Social  Determinants of Health   Financial Resource Strain: Low Risk  (12/04/2022)   Overall Financial Resource Strain (CARDIA)    Difficulty of Paying Living Expenses: Not hard at all  Food Insecurity: No Food Insecurity (12/04/2022)   Hunger Vital Sign    Worried About Running Out of Food in the Last Year: Never true    Ran Out of Food in the Last Year: Never true  Transportation Needs: No Transportation Needs (12/04/2022)   PRAPARE - Administrator, Civil Service (Medical): No    Lack of Transportation (Non-Medical): No  Physical Activity: Insufficiently Active (12/04/2022)   Exercise Vital Sign    Days of Exercise per Week: 2 days    Minutes of Exercise per Session: 10 min  Stress: No Stress Concern Present (12/04/2022)   [employer] of Occupational Health - Occupational Stress Questionnaire    Feeling of Stress : Only a little  Social Connections: Moderately Isolated (12/04/2022)   Social Connection and Isolation Panel [NHANES]    Frequency of Communication with Friends and Family: Twice a week    Frequency of Social Gatherings with Friends and Family: Once a week    Attends Religious Services: Never    Database administrator or Organizations: No    Attends Engineer, structural: Not on file    Marital Status: Living with partner  Intimate Partner Violence: Not on file    Review of Systems:  All other review of systems negative except as mentioned in the HPI.  Physical Exam: Vital signs BP 129/71   Pulse 85   Temp (!) 97.5 F (36.4 C) (Temporal)   Ht 5\' 6"  (1.676 m)   Wt 222 lb (100.7 kg)   LMP 06/08/2018 (Approximate)   SpO2 95%   BMI 35.83 kg/m   General:   Alert,  Well-developed, well-nourished, pleasant and cooperative in NAD Airway:  Mallampati 1 Lungs:  Clear throughout to auscultation.   Heart:  Regular rate and rhythm; no murmurs, clicks, rubs,  or gallops. Abdomen:  Soft, nontender and nondistended. Normal bowel sounds.   Neuro/Psych:   Normal mood and affect. A and O x 3   Izza Bickle E. Tomasa Rand, MD [facility]

## 2023-01-23 NOTE — Progress Notes (Signed)
Called to room to assist during endoscopic procedure.  Patient ID and intended procedure confirmed with present staff. Received instructions for my participation in the procedure from the performing physician.  

## 2023-01-24 ENCOUNTER — Telehealth: Payer: Self-pay | Admitting: *Deleted

## 2023-01-24 NOTE — Telephone Encounter (Signed)
  Follow up Call-     01/23/2023    7:32 AM  Call back number  Post procedure Call Back phone  # [phone]  Permission to leave phone message Yes     Patient questions:  Do you have a fever, pain , or abdominal swelling? No. Pain Score  0 *  Have you tolerated food without any problems? Yes.    Have you been able to return to your normal activities? Yes.    Do you have any questions about your discharge instructions: Diet   No. Medications  No. Follow up visit  No.  Do you have questions or concerns about your Care? No.  Actions: * If pain score is 4 or above: No action needed, pain <4.

## 2023-01-27 ENCOUNTER — Encounter: Payer: Self-pay | Admitting: Obstetrics and Gynecology

## 2023-01-27 ENCOUNTER — Ambulatory Visit (INDEPENDENT_AMBULATORY_CARE_PROVIDER_SITE_OTHER): Payer: BC Managed Care – PPO | Admitting: Obstetrics and Gynecology

## 2023-01-27 ENCOUNTER — Other Ambulatory Visit: Payer: Self-pay

## 2023-01-27 VITALS — BP 131/92 | HR 75 | Wt 220.0 lb

## 2023-01-27 DIAGNOSIS — N83202 Unspecified ovarian cyst, left side: Secondary | ICD-10-CM | POA: Diagnosis not present

## 2023-01-27 DIAGNOSIS — R102 Pelvic and perineal pain: Secondary | ICD-10-CM

## 2023-01-27 NOTE — Progress Notes (Unsigned)
Pelvic ultrasound has been scheduled for Friday, February 12, 2023 @145pm .  Location: [facility].

## 2023-01-27 NOTE — Progress Notes (Unsigned)
NEW GYNECOLOGY PATIENT Patient name: Ashley Garcia MRN [id number]  Date of birth: 1965-09-10 Chief Complaint:   Gynecologic Exam     History:  Ashley Garcia is a 57 y.o. G0P0000 being seen today for concern for ovarian cyst.    2020 menopause. No additional abdominal pain since  Not currently sexually active, no masturbation No abnormal paps in the past  Paternal aunt with breast cancer  No voiding habit changes  Just had colonoscopy on Thursday where polyp was removed, but otherwise normal   Had a pap in march 2023 in [place] and it was normal   Gynecologic History Patient's last menstrual period was 06/08/2018 (approximate). Contraception: abstinence Last Pap:     Component Value Date/Time   DIAGPAP  12/23/2017 0000    NEGATIVE FOR INTRAEPITHELIAL LESIONS OR MALIGNANCY.   ADEQPAP  12/23/2017 0000    Satisfactory for evaluation  endocervical/transformation zone component PRESENT.  Last Mammogram: 10/2022 BIRADS 1 Last Colonoscopy: 01/2023  Obstetric History OB History  Gravida Para Term Preterm AB Living  0 0 0 0 0 0  SAB IAB Ectopic Multiple Live Births  0 0 0 0 0    Past Medical History:  Diagnosis Date   Allergy    Depression    Hyperlipidemia     Past Surgical History:  Procedure Laterality Date   hip replacment Left    age 26   KNEE ARTHROSCOPY W/ MENISCECTOMY Right    PERCUTANEOUS PINNING METATARSAL FRACTURE      Current Outpatient Medications on File Prior to Visit  Medication Sig Dispense Refill   Ascorbic Acid (VITAMIN C) 1000 MG tablet Take 1,000 mg by mouth daily.     B Complex Vitamins (VITAMIN-B COMPLEX PO)      Biotin 10 MG CAPS Take 1 capsule by mouth daily.     Cholecalciferol (VITAMIN D3) 50 MCG (2000 UT) TABS      fexofenadine (ALLEGRA) 180 MG tablet TAKE 1 TABLET BY MOUTH EVERY DAY 90 tablet 0   FLUoxetine (PROZAC) 40 MG capsule Take 40 mg by mouth daily.     MELATONIN PO Take 1 tablet by mouth daily as needed (For sleep).      rosuvastatin (CRESTOR) 10 MG tablet Take 1 tablet (10 mg total) by mouth daily. 30 tablet 2   Turmeric (QC TUMERIC COMPLEX) 500 MG CAPS      Semaglutide-Weight Management (WEGOVY) 0.5 MG/0.5ML SOAJ Inject 0.5 mg into the skin once a week. 2 mL 2   No current facility-administered medications on file prior to visit.    No Known Allergies  Social History:  reports that she quit smoking about 6 years ago. Her smoking use included cigars. She has never used smokeless tobacco. She reports current alcohol use of about 7.0 standard drinks of alcohol per week. She reports that she does not use drugs.  Family History  Problem Relation Age of Onset   Hypertension Mother    Liver cancer Father    Breast cancer Paternal Aunt 77   Colon cancer Neg Hx    Colon polyps Neg Hx    Stomach cancer Neg Hx    Rectal cancer Neg Hx    Esophageal cancer Neg Hx     The following portions of the patient's history were reviewed and updated as appropriate: allergies, current medications, past family history, past medical history, past social history, past surgical history and problem list.  Review of Systems Pertinent items noted in HPI and remainder of comprehensive  ROS otherwise negative.  Physical Exam:  BP (!) 131/92   Pulse 75   Wt 220 lb (99.8 kg)   LMP 06/08/2018 (Approximate)   BMI 35.51 kg/m  Physical Exam Vitals and nursing note reviewed. Exam conducted with a chaperone present.  Constitutional:      Appearance: Normal appearance.  Cardiovascular:     Rate and Rhythm: Normal rate.  Pulmonary:     Effort: Pulmonary effort is normal.     Breath sounds: Normal breath sounds.  Genitourinary:    General: Normal vulva.     Exam position: Lithotomy position.     Uterus: Normal.      Adnexa: Right adnexa normal and left adnexa normal.  Neurological:     General: No focal deficit present.     Mental Status: She is alert and oriented to person, place, and time.  Psychiatric:        Mood and  Affect: Mood normal.        Behavior: Behavior normal.        Thought Content: Thought content normal.        Judgment: Judgment normal.       Assessment and Plan:   1. Cyst of left ovary Pelvic US ordered for better adnexal characterization and assess if ovarian cyst remains. If abnormal findings, will continue workup and possible surgery vs referral if needed. Currently asymptomatic without risk factors for ovarian malignancy.  - US PELVIC COMPLETE WITH TRANSVAGINAL; Future    Routine preventative health maintenance measures emphasized. Please refer to After Visit Summary for other counseling recommendations.   Follow-up: No follow-ups on file.      Lorriane Shire, MD Obstetrician & Gynecologist, Faculty Practice Minimally Invasive Gynecologic [facility] for [employer], [facility]

## 2023-01-31 ENCOUNTER — Ambulatory Visit (HOSPITAL_COMMUNITY): Admission: RE | Admit: 2023-01-31 | Payer: BC Managed Care – PPO | Source: Ambulatory Visit

## 2023-01-31 DIAGNOSIS — N83202 Unspecified ovarian cyst, left side: Secondary | ICD-10-CM | POA: Insufficient documentation

## 2023-01-31 DIAGNOSIS — D259 Leiomyoma of uterus, unspecified: Secondary | ICD-10-CM | POA: Diagnosis not present

## 2023-01-31 DIAGNOSIS — R102 Pelvic and perineal pain: Secondary | ICD-10-CM | POA: Diagnosis not present

## 2023-01-31 NOTE — Progress Notes (Signed)
Ashley Garcia,  Good news: the polyp (or polyps) that I removed during your recent examination were NOT precancerous.  You should continue to follow current colorectal cancer screening guidelines with a repeat colonoscopy in 10 years.    If you develop any new rectal bleeding, abdominal pain or significant bowel habit changes, please contact me before then.

## 2023-02-16 DIAGNOSIS — R7309 Other abnormal glucose: Secondary | ICD-10-CM | POA: Diagnosis not present

## 2023-02-21 ENCOUNTER — Other Ambulatory Visit: Payer: Self-pay | Admitting: Nurse Practitioner

## 2023-03-13 ENCOUNTER — Other Ambulatory Visit: Payer: Self-pay

## 2023-03-13 ENCOUNTER — Ambulatory Visit: Payer: BC Managed Care – PPO | Admitting: Obstetrics and Gynecology

## 2023-03-13 ENCOUNTER — Encounter: Payer: Self-pay | Admitting: Obstetrics and Gynecology

## 2023-03-13 VITALS — BP 130/87 | HR 82 | Ht 66.0 in | Wt 220.3 lb

## 2023-03-13 DIAGNOSIS — N83202 Unspecified ovarian cyst, left side: Secondary | ICD-10-CM

## 2023-03-13 DIAGNOSIS — N882 Stricture and stenosis of cervix uteri: Secondary | ICD-10-CM

## 2023-03-13 MED ORDER — MISOPROSTOL 200 MCG PO TABS
ORAL_TABLET | ORAL | 0 refills | Status: DC
Start: 2023-03-13 — End: 2023-04-10

## 2023-03-13 NOTE — Progress Notes (Signed)
GYNECOLOGY VISIT  Patient name: Ashley Garcia MRN [id number]  Date of birth: 30-Dec-1965 Chief Complaint:   No chief complaint on file.   History:  Ashley Garcia is a 57 y.o. G0P0000 being seen today for EMB and ROMA testing. Underwent US to better characterize ovarian cyst. Has not had any vaginal bleeding. Wants to be sure any and all testing or treatment are done by the end of the year as she has met her deductible.     Past Medical History:  Diagnosis Date   Allergy    Depression    Hyperlipidemia     Past Surgical History:  Procedure Laterality Date   hip replacment Left    age 11   KNEE ARTHROSCOPY W/ MENISCECTOMY Right    PERCUTANEOUS PINNING METATARSAL FRACTURE      The following portions of the patient's history were reviewed and updated as appropriate: allergies, current medications, past family history, past medical history, past social history, past surgical history and problem list.     Review of Systems:  Pertinent items are noted in HPI. Comprehensive review of systems was otherwise negative.   Objective:  Physical Exam BP 130/87   Pulse 82   Ht 5\' 6"  (1.676 m)   Wt 220 lb 4.8 oz (99.9 kg)   LMP 06/08/2018 (Approximate)   BMI 35.56 kg/m    Physical Exam Vitals and nursing note reviewed. Exam conducted with a chaperone present.  Constitutional:      Appearance: Normal appearance.  HENT:     Head: Normocephalic and atraumatic.  Pulmonary:     Effort: Pulmonary effort is normal.     Breath sounds: Normal breath sounds.  Genitourinary:    General: Normal vulva.     Exam position: Lithotomy position.     Vagina: Normal.     Cervix: Normal.  Skin:    General: Skin is warm and dry.  Neurological:     General: No focal deficit present.     Mental Status: She is alert.  Psychiatric:        Mood and Affect: Mood normal.        Behavior: Behavior normal.        Thought Content: Thought content normal.        Judgment: Judgment normal.      Endometrial Biopsy Procedure  Patient identified, informed consent performed,  indication reviewed, consent signed.  Reviewed risk of perforation, pain, bleeding, insufficient sample, etc were reviewd. Time out was performed.    Speculum placed in the vagina.  Cervix visualized.  Cleaned with Betadine x 2.  Anterior cervix grasped anteriorly with a single tooth tenaculum.  Paracervical block was not administered.  Endometrial pipelle was used to draw up 1cc of 1% lidocaine, and attempted to be introduced into the endometrial cavity, but the external os was closed. Attempted to dilate with disposable dilators but unable to even slightly enter external os. Procedure was aborted. Tenaculum was removed, good hemostasis noted.  Patient tolerated procedure well.       Assessment & Plan:   1. Cyst of left ovary Discussed that pelvic US demonstrates a few abnormalities that are slightly concerning. Will obtain ROMA today for risk stratification and discuss possible oophorectomy.  - Ovarian Malignancy Risk-ROMA  2. Cervical stenosis (uterine cervix) Attempted EMB today for thickened EMS found on pelvic US. Noted to have cervical stenosis and unable to dilate cervix. Offered repeat in office vs sampling in the OR and patient elects  for OR. Will plan for preop PV miso for cervical ripening.  - misoprostol (CYTOTEC) 200 MCG tablet; Place two tablets in the vagina the night prior procedure  Dispense: 2 tablet; Refill: 0  Routine preventative health maintenance measures emphasized.  Ashley Shire, MD Minimally Invasive Gynecologic Surgery [facility] for [facility], [facility]

## 2023-03-13 NOTE — H&P (View-Only) (Signed)
GYNECOLOGY VISIT  Patient name: Ashley Garcia MRN [id number]  Date of birth: 30-Dec-1965 Chief Complaint:   No chief complaint on file.   History:  Ashley Garcia is a 57 y.o. G0P0000 being seen today for EMB and ROMA testing. Underwent US to better characterize ovarian cyst. Has not had any vaginal bleeding. Wants to be sure any and all testing or treatment are done by the end of the year as Ashley Garcia has met her deductible.     Past Medical History:  Diagnosis Date   Allergy    Depression    Hyperlipidemia     Past Surgical History:  Procedure Laterality Date   hip replacment Left    age 11   KNEE ARTHROSCOPY W/ MENISCECTOMY Right    PERCUTANEOUS PINNING METATARSAL FRACTURE      The following portions of the patient's history were reviewed and updated as appropriate: allergies, current medications, past family history, past medical history, past social history, past surgical history and problem list.     Review of Systems:  Pertinent items are noted in HPI. Comprehensive review of systems was otherwise negative.   Objective:  Physical Exam BP 130/87   Pulse 82   Ht 5\' 6"  (1.676 m)   Wt 220 lb 4.8 oz (99.9 kg)   LMP 06/08/2018 (Approximate)   BMI 35.56 kg/m    Physical Exam Vitals and nursing note reviewed. Exam conducted with a chaperone present.  Constitutional:      Appearance: Normal appearance.  HENT:     Head: Normocephalic and atraumatic.  Pulmonary:     Effort: Pulmonary effort is normal.     Breath sounds: Normal breath sounds.  Genitourinary:    General: Normal vulva.     Exam position: Lithotomy position.     Vagina: Normal.     Cervix: Normal.  Skin:    General: Skin is warm and dry.  Neurological:     General: No focal deficit present.     Mental Status: Ashley Garcia is alert.  Psychiatric:        Mood and Affect: Mood normal.        Behavior: Behavior normal.        Thought Content: Thought content normal.        Judgment: Judgment normal.      Endometrial Biopsy Procedure  Patient identified, informed consent performed,  indication reviewed, consent signed.  Reviewed risk of perforation, pain, bleeding, insufficient sample, etc were reviewd. Time out was performed.    Speculum placed in the vagina.  Cervix visualized.  Cleaned with Betadine x 2.  Anterior cervix grasped anteriorly with a single tooth tenaculum.  Paracervical block was not administered.  Endometrial pipelle was used to draw up 1cc of 1% lidocaine, and attempted to be introduced into the endometrial cavity, but the external os was closed. Attempted to dilate with disposable dilators but unable to even slightly enter external os. Procedure was aborted. Tenaculum was removed, good hemostasis noted.  Patient tolerated procedure well.       Assessment & Plan:   1. Cyst of left ovary Discussed that pelvic US demonstrates a few abnormalities that are slightly concerning. Will obtain ROMA today for risk stratification and discuss possible oophorectomy.  - Ovarian Malignancy Risk-ROMA  2. Cervical stenosis (uterine cervix) Attempted EMB today for thickened EMS found on pelvic US. Noted to have cervical stenosis and unable to dilate cervix. Offered repeat in office vs sampling in the OR and patient elects  for OR. Will plan for preop PV miso for cervical ripening.  - misoprostol (CYTOTEC) 200 MCG tablet; Place two tablets in the vagina the night prior procedure  Dispense: 2 tablet; Refill: 0  Routine preventative health maintenance measures emphasized.  Lorriane Shire, MD Minimally Invasive Gynecologic [facility] for [facility], [facility]

## 2023-03-14 LAB — OVARIAN MALIGNANCY RISK-ROMA
Cancer Antigen (CA) 125: 3 U/mL (ref 0.0–38.1)
HE4: 62 pmol/L (ref 0.0–105.2)
Postmenopausal ROMA: 0.48
Premenopausal ROMA: 1.08

## 2023-03-14 LAB — PREMENOPAUSAL INTERP: LOW

## 2023-03-14 LAB — POSTMENOPAUSAL INTERP: LOW

## 2023-03-18 ENCOUNTER — Encounter: Payer: Self-pay | Admitting: Obstetrics and Gynecology

## 2023-03-18 DIAGNOSIS — R7309 Other abnormal glucose: Secondary | ICD-10-CM | POA: Diagnosis not present

## 2023-03-19 ENCOUNTER — Telehealth: Payer: Self-pay | Admitting: Obstetrics and Gynecology

## 2023-03-19 NOTE — Telephone Encounter (Signed)
Called patient x2, no answer. LVM. Will call again at a later time

## 2023-03-20 ENCOUNTER — Telehealth: Payer: Self-pay | Admitting: Obstetrics and Gynecology

## 2023-03-20 ENCOUNTER — Telehealth: Payer: Self-pay

## 2023-03-20 ENCOUNTER — Encounter: Payer: Self-pay | Admitting: Obstetrics and Gynecology

## 2023-03-20 ENCOUNTER — Ambulatory Visit: Payer: Self-pay | Admitting: Nurse Practitioner

## 2023-03-20 NOTE — Telephone Encounter (Signed)
Called patient and confirmed ID x2. Reviewed that ROMA negative. Reviewed option for pursuing laparoscopic BSO w/ hysteroscopy or just hysteroscopy. After review of options and possible outcomes, decision made to proceed with hysteroscopy/endometrial sampling and pending results, can decide if BSO is appropriate or if GYN ONC referral required and can have definitive treatment at that point.   Lorriane Shire, MD, FACOG Minimally Invasive Gynecologic Surgery  Obstetrics and Gynecology, [facility] for [facility], [facility] 03/20/2023

## 2023-03-20 NOTE — Telephone Encounter (Signed)
Called patient to schedule surgery with Dr. Briscoe Deutscher. Patient chose 03/26/23 @3 :30 pm at [facility]. Pre-op instructions were provided by phone and patient is aware she must arrive by 1:30 pm.

## 2023-03-24 ENCOUNTER — Other Ambulatory Visit: Payer: Self-pay | Admitting: Nurse Practitioner

## 2023-03-24 MED ORDER — WEGOVY 1 MG/0.5ML ~~LOC~~ SOAJ: 1.0000 mg | SUBCUTANEOUS | 2 refills | Status: DC

## 2023-03-25 ENCOUNTER — Other Ambulatory Visit: Payer: Self-pay

## 2023-03-25 ENCOUNTER — Encounter (HOSPITAL_COMMUNITY): Payer: Self-pay | Admitting: Obstetrics and Gynecology

## 2023-03-25 NOTE — Progress Notes (Addendum)
PCP - Ivonne Andrew, NP Cardiologist -   PPM/ICD - denies Device Orders -  Rep Notified -   Chest x-ray - denies EKG - denies Stress Test - denies ECHO - denies Cardiac Cath - denies  CPAP - denies  DM- Denies  Blood Thinner Instructions: denies Aspirin Instructions: n/a  ERAS Protcol - NPO  COVID TEST- n/a  Anesthesia review: no   LAST DOSE WEGOVY 03-16-23  Patient verbally denies any shortness of breath, fever, cough and chest pain during phone call   -------------  SDW INSTRUCTIONS given:  Your procedure is scheduled on [facility] March 26, 2023  Report to [facility] Entrance "A" at 1:30 P.M., and check in at the Admitting office.  Call this number if you have problems the morning of surgery:  [phone]   Remember:  Do not eat  or drink after midnight the night before your surgery      Take these medicines the morning of surgery with A SIP OF WATER  fexofenadine (ALLEGRA)  FLUoxetine (PROZAC)  misoprostol (CYTOTEC)  rosuvastatin (CRESTOR)   Semaglutide-Weight Management (WEGOVY) LAST DOSE   IF NEEDED  As of today, STOP taking any Aspirin (unless otherwise instructed by your surgeon) Aleve, Naproxen, Ibuprofen, Motrin, Advil, Goody's, BC's, all herbal medications, fish oil, and all vitamins.                      Do not wear jewelry, make up, or nail polish            Do not wear lotions, powders, perfumes/colognes, or deodorant.            Do not shave 48 hours prior to surgery.  Men may shave face and neck.            Do not bring valuables to the hospital.            [facility] is not responsible for any belongings or valuables.  Do NOT Smoke (Tobacco/Vaping) 24 hours prior to your procedure If you use a CPAP at night, you may bring all equipment for your overnight stay.   Contacts, glasses, dentures or bridgework may not be worn into surgery.      For patients admitted to the hospital, discharge time will be determined by your  treatment team.   Patients discharged the day of surgery will not be allowed to drive home, and someone needs to stay with them for 24 hours.    Special instructions:   [place]- Preparing For Surgery  Before surgery, you can play an important role. Because skin is not sterile, your skin needs to be as free of germs as possible. You can reduce the number of germs on your skin by washing with CHG (chlorahexidine gluconate) Soap before surgery.  CHG is an antiseptic cleaner which kills germs and bonds with the skin to continue killing germs even after washing.    Oral Hygiene is also important to reduce your risk of infection.  Remember - BRUSH YOUR TEETH THE MORNING OF SURGERY WITH YOUR REGULAR TOOTHPASTE  Please do not use if you have an allergy to CHG or antibacterial soaps. If your skin becomes reddened/irritated stop using the CHG.  Do not shave (including legs and underarms) for at least 48 hours prior to first CHG shower. It is OK to shave your face.  Please follow these instructions carefully.   Shower the NIGHT BEFORE SURGERY and the MORNING OF SURGERY with DIAL Soap.  Pat yourself dry with a CLEAN TOWEL.  Wear CLEAN PAJAMAS to bed the night before surgery  Place CLEAN SHEETS on your bed the night of your first shower and DO NOT SLEEP WITH PETS.   Day of Surgery: Please shower morning of surgery  Wear Clean/Comfortable clothing the morning of surgery Do not apply any deodorants/lotions.   Remember to brush your teeth WITH YOUR REGULAR TOOTHPASTE.   Questions were answered. Patient verbalized understanding of instructions.

## 2023-03-26 ENCOUNTER — Other Ambulatory Visit: Payer: Self-pay

## 2023-03-26 ENCOUNTER — Encounter (HOSPITAL_COMMUNITY)
Admission: RE | Disposition: A | Discharge status: Home / Self Care | Payer: Self-pay | Source: Home / Self Care | Attending: Obstetrics and Gynecology

## 2023-03-26 ENCOUNTER — Ambulatory Visit (HOSPITAL_COMMUNITY): Payer: BC Managed Care – PPO | Admitting: Anesthesiology

## 2023-03-26 ENCOUNTER — Ambulatory Visit (HOSPITAL_COMMUNITY)
Admission: RE | Admit: 2023-03-26 | Discharge: 2023-03-26 | Disposition: A | Discharge status: Home / Self Care | Payer: BC Managed Care – PPO | Attending: Obstetrics and Gynecology | Admitting: Obstetrics and Gynecology

## 2023-03-26 ENCOUNTER — Encounter (HOSPITAL_COMMUNITY): Payer: Self-pay | Admitting: Obstetrics and Gynecology

## 2023-03-26 DIAGNOSIS — N83202 Unspecified ovarian cyst, left side: Secondary | ICD-10-CM | POA: Diagnosis not present

## 2023-03-26 DIAGNOSIS — R9389 Abnormal findings on diagnostic imaging of other specified body structures: Secondary | ICD-10-CM | POA: Diagnosis not present

## 2023-03-26 DIAGNOSIS — N882 Stricture and stenosis of cervix uteri: Secondary | ICD-10-CM | POA: Insufficient documentation

## 2023-03-26 HISTORY — DX: Unspecified osteoarthritis, unspecified site: M19.90

## 2023-03-26 HISTORY — PX: HYSTEROSCOPY WITH D & C: SHX1775

## 2023-03-26 LAB — CBC
HCT: 38.5 % (ref 36.0–46.0)
Hemoglobin: 12.4 g/dL (ref 12.0–15.0)
MCH: 30.3 pg (ref 26.0–34.0)
MCHC: 32.2 g/dL (ref 30.0–36.0)
MCV: 94.1 fL (ref 80.0–100.0)
Platelets: 363 10*3/uL (ref 150–400)
RBC: 4.09 MIL/uL (ref 3.87–5.11)
RDW: 12.5 % (ref 11.5–15.5)
WBC: 7.3 10*3/uL (ref 4.0–10.5)
nRBC: 0 % (ref 0.0–0.2)

## 2023-03-26 SURGERY — DILATATION AND CURETTAGE /HYSTEROSCOPY
Anesthesia: General

## 2023-03-26 MED ORDER — LIDOCAINE 2% (20 MG/ML) 5 ML SYRINGE
INTRAMUSCULAR | Status: DC | PRN
  Administered 2023-03-26: 60 mg via INTRAVENOUS

## 2023-03-26 MED ORDER — DEXAMETHASONE SODIUM PHOSPHATE 10 MG/ML IJ SOLN
INTRAMUSCULAR | Status: DC | PRN
  Administered 2023-03-26 (×2): 5 mg via INTRAVENOUS

## 2023-03-26 MED ORDER — FENTANYL CITRATE (PF) 100 MCG/2ML IJ SOLN: 25.0000 ug | INTRAMUSCULAR | Status: DC | PRN

## 2023-03-26 MED ORDER — SODIUM CHLORIDE 0.9 % IR SOLN
Status: DC | PRN
  Administered 2023-03-26: 3000 mL

## 2023-03-26 MED ORDER — ORAL CARE MOUTH RINSE: 15.0000 mL | Freq: Once | OROMUCOSAL | Status: AC

## 2023-03-26 MED ORDER — EPHEDRINE 5 MG/ML INJ
INTRAVENOUS | Status: AC
  Filled 2023-03-26: qty 5

## 2023-03-26 MED ORDER — ONDANSETRON HCL 4 MG/2ML IJ SOLN
INTRAMUSCULAR | Status: DC | PRN
  Administered 2023-03-26: 4 mg via INTRAVENOUS

## 2023-03-26 MED ORDER — FENTANYL CITRATE (PF) 250 MCG/5ML IJ SOLN
INTRAMUSCULAR | Status: AC
  Filled 2023-03-26: qty 5

## 2023-03-26 MED ORDER — MIDAZOLAM HCL 2 MG/2ML IJ SOLN
INTRAMUSCULAR | Status: DC | PRN
  Administered 2023-03-26 (×2): 1 mg via INTRAVENOUS

## 2023-03-26 MED ORDER — IBUPROFEN 600 MG PO TABS: 600.0000 mg | ORAL_TABLET | Freq: Four times a day (QID) | ORAL | 3 refills | Status: AC | PRN

## 2023-03-26 MED ORDER — FENTANYL CITRATE (PF) 250 MCG/5ML IJ SOLN
INTRAMUSCULAR | Status: DC | PRN
  Administered 2023-03-26: 50 ug via INTRAVENOUS
  Administered 2023-03-26 (×2): 25 ug via INTRAVENOUS

## 2023-03-26 MED ORDER — PROPOFOL 10 MG/ML IV BOLUS
INTRAVENOUS | Status: DC | PRN
  Administered 2023-03-26: 200 mg via INTRAVENOUS

## 2023-03-26 MED ORDER — KETOROLAC TROMETHAMINE 30 MG/ML IJ SOLN
INTRAMUSCULAR | Status: DC | PRN
Start: 2023-03-26 — End: 2023-03-26
  Administered 2023-03-26: 30 mg via INTRAVENOUS

## 2023-03-26 MED ORDER — MIDAZOLAM HCL 2 MG/2ML IJ SOLN
INTRAMUSCULAR | Status: AC
  Filled 2023-03-26: qty 2

## 2023-03-26 MED ORDER — CHLORHEXIDINE GLUCONATE 0.12 % MT SOLN
15.0000 mL | Freq: Once | OROMUCOSAL | Status: AC
  Administered 2023-03-26: 15 mL via OROMUCOSAL
  Filled 2023-03-26: qty 15

## 2023-03-26 MED ORDER — GLYCOPYRROLATE PF 0.2 MG/ML IJ SOSY
PREFILLED_SYRINGE | INTRAMUSCULAR | Status: AC
  Filled 2023-03-26: qty 1

## 2023-03-26 MED ORDER — LACTATED RINGERS IV SOLN: INTRAVENOUS | Status: DC

## 2023-03-26 MED ORDER — GLYCOPYRROLATE PF 0.2 MG/ML IJ SOSY
PREFILLED_SYRINGE | INTRAMUSCULAR | Status: DC | PRN
Start: 2023-03-26 — End: 2023-03-26
  Administered 2023-03-26: .2 mg via INTRAVENOUS

## 2023-03-26 MED ORDER — ACETAMINOPHEN 10 MG/ML IV SOLN: 1000.0000 mg | Freq: Once | INTRAVENOUS | Status: DC | PRN

## 2023-03-26 MED ORDER — BUPIVACAINE HCL (PF) 0.5 % IJ SOLN
INTRAMUSCULAR | Status: DC | PRN
  Administered 2023-03-26: 10 mL

## 2023-03-26 MED ORDER — LIDOCAINE 2% (20 MG/ML) 5 ML SYRINGE
INTRAMUSCULAR | Status: AC
  Filled 2023-03-26: qty 5

## 2023-03-26 MED ORDER — OXYCODONE HCL 5 MG PO TABS
5.0000 mg | ORAL_TABLET | Freq: Once | ORAL | Status: AC | PRN
  Administered 2023-03-26: 5 mg via ORAL

## 2023-03-26 MED ORDER — ACETAMINOPHEN 500 MG PO TABS
1000.0000 mg | ORAL_TABLET | ORAL | Status: AC
  Administered 2023-03-26: 1000 mg via ORAL
  Filled 2023-03-26: qty 2

## 2023-03-26 MED ORDER — PROPOFOL 10 MG/ML IV BOLUS
INTRAVENOUS | Status: AC
  Filled 2023-03-26: qty 20

## 2023-03-26 MED ORDER — ONDANSETRON HCL 4 MG/2ML IJ SOLN: 4.0000 mg | Freq: Once | INTRAMUSCULAR | Status: DC | PRN

## 2023-03-26 MED ORDER — EPHEDRINE SULFATE-NACL 50-0.9 MG/10ML-% IV SOSY
PREFILLED_SYRINGE | INTRAVENOUS | Status: DC | PRN
  Administered 2023-03-26 (×2): 5 mg via INTRAVENOUS

## 2023-03-26 MED ORDER — OXYCODONE HCL 5 MG/5ML PO SOLN: 5.0000 mg | Freq: Once | ORAL | Status: AC | PRN

## 2023-03-26 MED ORDER — OXYCODONE HCL 5 MG PO TABS
ORAL_TABLET | ORAL | Status: AC
  Filled 2023-03-26: qty 1

## 2023-03-26 MED ORDER — ACETAMINOPHEN 500 MG PO TABS: 500.0000 mg | ORAL_TABLET | Freq: Four times a day (QID) | ORAL | 0 refills | Status: AC | PRN

## 2023-03-26 MED ORDER — 0.9 % SODIUM CHLORIDE (POUR BTL) OPTIME
TOPICAL | Status: DC | PRN
Start: 2023-03-26 — End: 2023-03-26
  Administered 2023-03-26: 1000 mL

## 2023-03-26 SURGICAL SUPPLY — 16 items
CATH ROBINSON RED A/P 16FR (CATHETERS) IMPLANT
DEVICE MYOSURE LITE (MISCELLANEOUS) IMPLANT
DEVICE MYOSURE REACH (MISCELLANEOUS) IMPLANT
GAUZE 4X4 16PLY ~~LOC~~+RFID DBL (SPONGE) IMPLANT
GLOVE BIOGEL PI IND STRL 7.0 (GLOVE) IMPLANT
GLOVE ECLIPSE 7.0 STRL STRAW (GLOVE) ×1 IMPLANT
GOWN STRL REUS W/TWL LRG LVL3 (GOWN DISPOSABLE) ×1 IMPLANT
GOWN STRL REUS W/TWL XL LVL3 (GOWN DISPOSABLE) ×1 IMPLANT
KIT PROCEDURE FLUENT (KITS) ×1 IMPLANT
KIT TURNOVER KIT A (KITS) ×1 IMPLANT
MYOSURE XL FIBROID (MISCELLANEOUS)
PACK VAGINAL MINOR WOMEN LF (CUSTOM PROCEDURE TRAY) ×1 IMPLANT
PAD OB MATERNITY 4.3X12.25 (PERSONAL CARE ITEMS) ×1 IMPLANT
SEAL CERVICAL OMNI LOK (ABLATOR) IMPLANT
SEAL ROD LENS SCOPE MYOSURE (ABLATOR) ×1 IMPLANT
SYSTEM TISS REMOVAL MYOSURE XL (MISCELLANEOUS) IMPLANT

## 2023-03-26 NOTE — Anesthesia Procedure Notes (Signed)
Procedure Name: LMA Insertion Date/Time: 03/26/2023 4:04 PM  Performed by: Thomasene Ripple, CRNAPre-anesthesia Checklist: Patient identified, Emergency Drugs available, Suction available and Patient being monitored Patient Re-evaluated:Patient Re-evaluated prior to induction Oxygen Delivery Method: Circle System Utilized Preoxygenation: Pre-oxygenation with 100% oxygen Induction Type: IV induction Ventilation: Mask ventilation without difficulty LMA: LMA inserted LMA Size: 4.0 Number of attempts: 1 Airway Equipment and Method: Bite block Placement Confirmation: positive ETCO2 Tube secured with: Tape Dental Injury: Teeth and Oropharynx as per pre-operative assessment

## 2023-03-26 NOTE — Interval H&P Note (Signed)
History and Physical Interval Note:  03/26/2023 1:07 PM  Ashley Garcia  has presented today for surgery, with the diagnosis of Thickened endometrium.  The various methods of treatment have been discussed with the patient and family. After consideration of risks, benefits and other options for treatment, the patient has consented to  Procedure(s): DILATATION AND CURETTAGE /HYSTEROSCOPY (N/A) OPERATIVE ULTRASOUND (N/A) as a surgical intervention.  The patient's history has been reviewed, patient examined, no change in status, stable for surgery.  I have reviewed the patient's chart and labs.  Questions were answered to the patient's satisfaction.     Kylan Liberati

## 2023-03-26 NOTE — Discharge Instructions (Signed)
Post-surgical Instructions, Outpatient Surgery  You may expect to feel dizzy, weak, and drowsy for as long as 24 hours after receiving the medicine that made you sleep (anesthetic). For the first 24 hours after your surgery:   Do not drive a car, ride a bicycle, participate in physical activities, or take public transportation until you are done taking narcotic pain medicines or as directed by Dr. Briscoe Deutscher.  Do not drink alcohol or take tranquilizers.  Do not take medicine that has not been prescribed by your physicians.  Do not sign important papers or make important decisions while on narcotic pain medicines.  Have a responsible person with you.    PAIN MANAGEMENT Ibuprofen 600mg .  (This is the same as 4-200mg  over the counter tablets of Motrin or ibuprofen.)  Take this every 6 hours or as needed for cramping.   Acetaminophen 1000mg  (This is the same as 2-500mg  over the counter extra strength tylenol). Take this every 6 hours for the first 3 days or as needed afterwards for pain  DO'S AND DON'T'S Do not take a tub bath for 4 weeks.  You may shower on the first day after your surgery Do not do any heavy lifting for one to two weeks.  This increases the chance of bleeding. Do move around as you feel able.  Stairs are fine.  You may begin to exercise again as you feel able.  Do not lift any weights for two weeks. Do not put anything in the vagina for two weeks--no tampons, intercourse, or douching.    REGULAR MEDIATIONS/VITAMINS: You may restart all of your regular medications as prescribed. You may restart all of your vitamins as you normally take them.    PLEASE CALL OR SEEK MEDICAL CARE IF: You have persistent nausea and vomiting.  You have trouble eating or drinking.  You have an oral temperature above 100.5.  You have constipation that is not helped by adjusting diet or increasing fluid intake. Pain medicines are a common cause of constipation.  You have heavy vaginal bleeding You  have redness or drainage from your incision(s) or there is increasing pain or tenderness near or in the surgical site.

## 2023-03-26 NOTE — Anesthesia Postprocedure Evaluation (Signed)
Anesthesia Post Note  Patient: Ashley Garcia  Procedure(s) Performed: DILATATION AND CURETTAGE /HYSTEROSCOPY     Patient location during evaluation: PACU Anesthesia Type: General Level of consciousness: awake and alert Pain management: pain level controlled Vital Signs Assessment: post-procedure vital signs reviewed and stable Respiratory status: spontaneous breathing, nonlabored ventilation, respiratory function stable and patient connected to nasal cannula oxygen Cardiovascular status: blood pressure returned to baseline and stable Postop Assessment: no apparent nausea or vomiting Anesthetic complications: no   No notable events documented.  Last Vitals:  Vitals:   03/26/23 1615 03/26/23 1630  BP: (!) 130/97 135/76  Pulse: (!) 112 98  Resp: 18 12  Temp: 36.9 C   SpO2: 100% 98%    Last Pain:  Vitals:   03/26/23 1615  TempSrc:   PainSc: 2                  Nelle Don Nickayla Mcinnis

## 2023-03-26 NOTE — Anesthesia Preprocedure Evaluation (Signed)
Anesthesia Evaluation  Patient identified by MRN, date of birth, ID band Patient awake    Reviewed: Allergy & Precautions, NPO status , Patient's Chart, lab work & pertinent test results, reviewed documented beta blocker date and time   History of Anesthesia Complications Negative for: history of anesthetic complications  Airway Mallampati: II  TM Distance: >3 FB     Dental no notable dental hx.    Pulmonary neg sleep apnea, neg COPD, former smoker, neg PE   breath sounds clear to auscultation       Cardiovascular (-) hypertension(-) CAD, (-) Past MI, (-) Cardiac Stents, (-) CABG, (-) Peripheral Vascular Disease, (-) CHF and (-) Orthopnea  Rhythm:Regular Rate:Normal     Neuro/Psych neg Seizures PSYCHIATRIC DISORDERS  Depression       GI/Hepatic ,neg GERD  ,,(+) neg Cirrhosis        Endo/Other  neg diabetes    Renal/GU Renal disease     Musculoskeletal  (+) Arthritis ,    Abdominal   Peds  Hematology   Anesthesia Other Findings   Reproductive/Obstetrics                              Anesthesia Physical Anesthesia Plan  ASA: 2  Anesthesia Plan: General   Post-op Pain Management:    Induction: Intravenous  PONV Risk Score and Plan: 2 and Ondansetron and Dexamethasone  Airway Management Planned: LMA  Additional Equipment:   Intra-op Plan:   Post-operative Plan: Extubation in OR  Informed Consent: I have reviewed the patients History and Physical, chart, labs and discussed the procedure including the risks, benefits and alternatives for the proposed anesthesia with the patient or authorized representative who has indicated his/her understanding and acceptance.     Dental advisory given  Plan Discussed with: CRNA  Anesthesia Plan Comments:          Anesthesia Quick Evaluation

## 2023-03-26 NOTE — Brief Op Note (Signed)
03/26/2023  4:01 PM  PATIENT:  Ashley Garcia  56 y.o. female  PRE-OPERATIVE DIAGNOSIS:  Thickened endometrium  POST-OPERATIVE DIAGNOSIS:  Thickened endometrium  PROCEDURE:  Procedure(s): DILATATION AND CURETTAGE /HYSTEROSCOPY (N/A)  SURGEON:  Surgeons and Role:    Lorriane Shire, MD - Primary  PHYSICIAN ASSISTANT: n/a  ASSISTANTS: none   ANESTHESIA:   general and paracervical block  EBL:  5 mL   BLOOD ADMINISTERED:none  DRAINS: none   LOCAL MEDICATIONS USED:  BUPIVICAINE   SPECIMEN:  Source of Specimen:  endometrial curettings  DISPOSITION OF SPECIMEN:  PATHOLOGY  COUNTS:  YES  TOURNIQUET:  * No tourniquets in log *  DICTATION: .Note written in EPIC  PLAN OF CARE: Discharge to home after PACU  PATIENT DISPOSITION:  PACU - hemodynamically stable.   Delay start of Pharmacological VTE agent (>24hrs) due to surgical blood loss or risk of bleeding: not applicable

## 2023-03-26 NOTE — Transfer of Care (Signed)
Immediate Anesthesia Transfer of Care Note  Patient: Ashley Garcia  Procedure(s) Performed: DILATATION AND CURETTAGE /HYSTEROSCOPY  Patient Location: PACU  Anesthesia Type:General  Level of Consciousness: awake, alert , and patient cooperative  Airway & Oxygen Therapy: Patient Spontanous Breathing and Patient connected to face mask oxygen  Post-op Assessment: Report given to RN and Post -op Vital signs reviewed and stable  Post vital signs: Reviewed and stable  Last Vitals:  Vitals Value Taken Time  BP 130/97 03/26/23 1615  Temp 36.9 C 03/26/23 1615  Pulse 102 03/26/23 1619  Resp 16 03/26/23 1619  SpO2 98 % 03/26/23 1619  Vitals shown include unfiled device data.  Last Pain:  Vitals:   03/26/23 1615  TempSrc:   PainSc: 2          Complications: No notable events documented.

## 2023-03-26 NOTE — Op Note (Addendum)
Ashley Garcia PROCEDURE DATE: 03/26/2023  PREOPERATIVE DIAGNOSIS: endometrial thickening on ultrasound  POSTOPERATIVE DIAGNOSIS: same PROCEDURE:  diagnostic hysteroscopy, dilation and curettage SURGEON: Lorriane Shire, MD ASSISTANT:  n/a  INDICATIONS: 57 y.o. G0P0000 with endometrial thickening noted on ultrasound.  Risks of surgery were discussed with the patient including but not limited to: bleeding which may require transfusion; infection which may require antibiotics; injury to surrounding organs; need for additional procedures including laparotomy;  and other postoperative/anesthesia complications. Written informed consent was obtained.    FINDINGS:  Normal external genitalia, normal appearing cervix with small scar over os Hysteroscopically: ?polypoid endometrium on posterior wall, atrophic endometrium, bilateral tubal ostia visualized   ANESTHESIA: General, paracervical block ESTIMATED BLOOD LOSS:  5 ml URINE OUTPUT: 15 ml SPECIMENS: endometrial curettings COMPLICATIONS:  None immediate.  FLUID DEFICIT: 15 ml normal saline  Procedure: The patient was taken to the operating room. SCDs were in place.  Time out was performed. Patient was placed in dorsolithotomy in [place] stirrups. She was prepped and draped in the usual sterile fashion. A Red Rubber catheter was used to drain her bladder. A speculum was placed in the vagina. The cervix was visualized anteriorly and grasped with a single-tooth tenaculum. Paracervical block was performed with 0.5% bupivicaine with 20 cc injected. The external os was punctured with a spinal needle. Sequential dilation was performed with Shawnie Pons dilators. The hysteroscope was inserted and the endometrial cavity and inspected. There above findings were noted in the endometrial cavity with both ostia seen.  The hysteroscope was removed. Sharp curettage was performed in all 4 quadrants. The hysteroscope was introduced to visualize that an adequate sample had  been taken. All instruments were removed from the vagina. All instrument, needle and lap counts were correct x2. The patient was awakened and is recovering in stable condition.   Lorriane Shire, MD Minimally Invasive Gynecologic Surgery  Obstetrics and Gynecology, [facility] for [facility], [facility] 03/26/2023

## 2023-03-27 ENCOUNTER — Encounter (HOSPITAL_COMMUNITY): Payer: Self-pay | Admitting: Obstetrics and Gynecology

## 2023-03-27 ENCOUNTER — Telehealth: Payer: Self-pay | Admitting: Family Medicine

## 2023-03-27 ENCOUNTER — Other Ambulatory Visit: Payer: Self-pay

## 2023-03-27 NOTE — Telephone Encounter (Signed)
Called pt and left message with appt details.

## 2023-03-28 LAB — SURGICAL PATHOLOGY

## 2023-04-10 ENCOUNTER — Encounter: Payer: Self-pay | Admitting: Nurse Practitioner

## 2023-04-10 ENCOUNTER — Ambulatory Visit (INDEPENDENT_AMBULATORY_CARE_PROVIDER_SITE_OTHER): Payer: BC Managed Care – PPO | Admitting: Nurse Practitioner

## 2023-04-10 VITALS — BP 115/69 | HR 86 | Temp 97.7°F | Wt 218.0 lb

## 2023-04-10 DIAGNOSIS — E6609 Other obesity due to excess calories: Secondary | ICD-10-CM | POA: Diagnosis not present

## 2023-04-10 DIAGNOSIS — Z6835 Body mass index (BMI) 35.0-35.9, adult: Secondary | ICD-10-CM | POA: Diagnosis not present

## 2023-04-10 DIAGNOSIS — E66812 Obesity, class 2: Secondary | ICD-10-CM | POA: Diagnosis not present

## 2023-04-10 MED ORDER — WEGOVY 1.7 MG/0.75ML ~~LOC~~ SOAJ
1.7000 mg | SUBCUTANEOUS | 0 refills | Status: DC
Start: 2023-04-10 — End: 2023-05-06

## 2023-04-10 NOTE — Patient Instructions (Addendum)
1. Class 2 obesity due to excess calories without serious comorbidity with body mass index (BMI) of 35.0 to 35.9 in adult   - Semaglutide-Weight Management (WEGOVY) 1.7 MG/0.75ML SOAJ; Inject 1.7 mg into the skin once a week.  Dispense: 3 mL; Refill: 0    Follow up:  Follow up in 3 months

## 2023-04-10 NOTE — Progress Notes (Signed)
Subjective   Patient ID: Ashley Garcia, female    DOB: 1965/10/20, 57 y.o.   MRN: [id number]  Chief Complaint  Patient presents with   Weight Loss    wegovy    Referring provider: Ivonne Andrew, NP  Ashley Garcia is a 57 y.o. female with Past Medical History: No date: Allergy No date: Arthritis No date: Depression No date: Hyperlipidemia   HPI  Patient presents today for follow-up on weight loss.  She is currently on Wegovy.  Overall she has been doing well.  She is trying to watch what she eats.  She does have some issues with slight constipation due to [facility] but she is following a high-fiber diet.  Patient is actually up 2 pounds since last visit. Denies f/c/s, n/v/d, hemoptysis, PND, leg swelling Denies chest pain or edema     No Known Allergies  Immunization History  Administered Date(s) Administered   PFIZER(Purple Top)SARS-COV-2 Vaccination 09/11/2019, 10/02/2019    Tobacco History: Social History   Tobacco Use  Smoking Status Former   Types: Cigars   Quit date: 2018   Years since quitting: 6.8  Smokeless Tobacco Never  Tobacco Comments   twice a month- cigar   Counseling given: Not Answered Tobacco comments: twice a month- cigar   Outpatient Encounter Medications as of 04/10/2023  Medication Sig   acetaminophen (TYLENOL) 500 MG tablet Take 1 tablet (500 mg total) by mouth every 6 (six) hours as needed.   Ascorbic Acid (VITAMIN C) 1000 MG tablet Take 1,000 mg by mouth daily.   B Complex Vitamins (VITAMIN-B COMPLEX PO) Take 1 tablet by mouth daily.   Biotin 10 MG CAPS Take 1 capsule by mouth daily. [facility] [employer] [facility]   Brimonidine Tartrate (LUMIFY) 0.025 % SOLN Place 1 drop into both eyes 2 (two) times a week.   Cholecalciferol (VITAMIN D3) 50 MCG (2000 UT) TABS Take 2,000 Units by mouth daily.   fexofenadine (ALLEGRA) 180 MG tablet TAKE 1 TABLET BY MOUTH EVERY DAY   FLUoxetine (PROZAC) 40 MG capsule Take 40 mg by mouth daily.    MELATONIN PO Take 3 mg by mouth 3 (three) times a week. 1.5 mg eache Chewable gummy   rosuvastatin (CRESTOR) 10 MG tablet TAKE 1 TABLET BY MOUTH EVERY DAY   Semaglutide-Weight Management (WEGOVY) 1.7 MG/0.75ML SOAJ Inject 1.7 mg into the skin once a week.   Turmeric (QC TUMERIC COMPLEX) 500 MG CAPS Take 500 mg by mouth daily.   [DISCONTINUED] Semaglutide-Weight Management (WEGOVY) 1 MG/0.5ML SOAJ Inject 1 mg into the skin once a week. (Patient taking differently: Inject 0.5 mg into the skin once a week.)   ibuprofen (ADVIL) 600 MG tablet Take 1 tablet (600 mg total) by mouth every 6 (six) hours as needed. (Patient not taking: Reported on 04/10/2023)   [DISCONTINUED] misoprostol (CYTOTEC) 200 MCG tablet Place two tablets in the vagina the night prior procedure   No facility-administered encounter medications on file as of 04/10/2023.    Review of Systems  Review of Systems  Constitutional: Negative.   HENT: Negative.    Cardiovascular: Negative.   Gastrointestinal: Negative.   Allergic/Immunologic: Negative.   Neurological: Negative.   Psychiatric/Behavioral: Negative.       Objective:   BP 115/69   Pulse 86   Temp 97.7 F (36.5 C)   Wt 218 lb (98.9 kg)   LMP 06/08/2018 (Approximate)   SpO2 98%   BMI 35.19 kg/m   Wt Readings from Last 5  Encounters:  04/10/23 218 lb (98.9 kg)  03/26/23 216 lb (98 kg)  03/13/23 220 lb 4.8 oz (99.9 kg)  01/27/23 220 lb (99.8 kg)  01/23/23 222 lb (100.7 kg)     Physical Exam Vitals and nursing note reviewed.  Constitutional:      General: She is not in acute distress.    Appearance: She is well-developed.  Cardiovascular:     Rate and Rhythm: Normal rate and regular rhythm.  Pulmonary:     Effort: Pulmonary effort is normal.     Breath sounds: Normal breath sounds.  Neurological:     Mental Status: She is alert and oriented to person, place, and time.       Assessment & Plan:   Class 2 obesity due to excess calories without  serious comorbidity with body mass index (BMI) of 35.0 to 35.9 in adult -     HBZJIR; Inject 1.7 mg into the skin once a week.  Dispense: 3 mL; Refill: 0     Return in about 3 months (around 07/11/2023).   Ivonne Andrew, NP 04/10/2023

## 2023-04-15 ENCOUNTER — Other Ambulatory Visit: Payer: Self-pay

## 2023-04-15 ENCOUNTER — Encounter: Payer: Self-pay | Admitting: Obstetrics and Gynecology

## 2023-04-15 ENCOUNTER — Ambulatory Visit (INDEPENDENT_AMBULATORY_CARE_PROVIDER_SITE_OTHER): Payer: BC Managed Care – PPO | Admitting: Obstetrics and Gynecology

## 2023-04-15 VITALS — BP 134/80 | HR 68 | Wt 218.4 lb

## 2023-04-15 DIAGNOSIS — Z1330 Encounter for screening examination for mental health and behavioral disorders, unspecified: Secondary | ICD-10-CM | POA: Diagnosis not present

## 2023-04-15 DIAGNOSIS — R9389 Abnormal findings on diagnostic imaging of other specified body structures: Secondary | ICD-10-CM

## 2023-04-15 DIAGNOSIS — N83202 Unspecified ovarian cyst, left side: Secondary | ICD-10-CM | POA: Diagnosis not present

## 2023-04-15 NOTE — Progress Notes (Signed)
   POSTOPERATIVE VISIT NOTE   Subjective:     Ashley Garcia is a 57 y.o. G0P0000 who presents to the clinic 3 weeks status post diagnostic hysteroscopy for  thickened endometrium . Eating a regular diet without difficulty. Bowel movements are normal. The patient is not having any pain. Incision: n/a Vaginal bleeding: finished the night of the procedure   The following portions of the patient's history were reviewed and updated as appropriate: allergies, current medications, past family history, past medical history, past social history, past surgical history, and problem list..   Review of Systems Pertinent items are noted in HPI.    Objective:    BP 134/80   Pulse 68   Wt 218 lb 6.4 oz (99.1 kg)   LMP 06/08/2018 (Approximate)   BMI 35.25 kg/m  General:  alert, cooperative, and no distress  Pelvic:   Exam deferred.    Pathology Results: FINAL MICROSCOPIC DIAGNOSIS:   A. ENDOMETRIUM, CURETTAGE:  - Benign inactive endometrium  - Negative for hyperplasia or malignancy    Assessment:   Doing well postoperatively. Operative findings again reviewed. Pathology report discussed.      Plan:   1. Continue any current medications. 2. Discussed prior finding of complex ovarian cyst. Has negative ROMA. Would prefer to watch. Will get MRI for better characterization. If no alarming findings, will plan for continued surveillance.  3.  Follow up as needed   Lorriane Shire, MD Obstetrician & Gynecologist, [facility] for [employer], [facility]

## 2023-04-25 ENCOUNTER — Other Ambulatory Visit: Payer: Self-pay

## 2023-04-25 ENCOUNTER — Telehealth: Payer: Self-pay

## 2023-04-25 NOTE — Telephone Encounter (Signed)
Pharmacy Patient Advocate Encounter   Received notification from CoverMyMeds that prior authorization for [facility]  is required/requested.   Insurance verification completed.   The patient is insured through [facility] .   Per test claim: PA required; PA submitted to above mentioned insurance via CoverMyMeds Key/confirmation #/EOC [id number] Status is pending  MANUALLY FAXED MISSING INFORMATION TO [employer] (F) [phone] CASE KEY NUMBER: [id number] TODAY 04/25/2023

## 2023-04-28 ENCOUNTER — Other Ambulatory Visit: Payer: Self-pay

## 2023-05-05 ENCOUNTER — Other Ambulatory Visit: Payer: Self-pay | Admitting: Nurse Practitioner

## 2023-05-05 DIAGNOSIS — E66812 Obesity, class 2: Secondary | ICD-10-CM

## 2023-05-08 ENCOUNTER — Other Ambulatory Visit (HOSPITAL_COMMUNITY): Payer: Self-pay | Admitting: Nurse Practitioner

## 2023-05-08 MED ORDER — WEGOVY 2.4 MG/0.75ML ~~LOC~~ SOAJ: 2.4000 mg | SUBCUTANEOUS | 2 refills | Status: AC

## 2023-06-03 ENCOUNTER — Other Ambulatory Visit: Payer: Self-pay | Admitting: Medical Genetics

## 2023-06-05 ENCOUNTER — Ambulatory Visit
Admission: RE | Admit: 2023-06-05 | Discharge: 2023-06-05 | Disposition: A | Discharge status: Home / Self Care | Payer: BC Managed Care – PPO | Source: Ambulatory Visit | Attending: Obstetrics and Gynecology | Admitting: Obstetrics and Gynecology

## 2023-06-05 DIAGNOSIS — Z780 Asymptomatic menopausal state: Secondary | ICD-10-CM | POA: Diagnosis not present

## 2023-06-05 DIAGNOSIS — N83202 Unspecified ovarian cyst, left side: Secondary | ICD-10-CM

## 2023-06-05 DIAGNOSIS — K5730 Diverticulosis of large intestine without perforation or abscess without bleeding: Secondary | ICD-10-CM | POA: Diagnosis not present

## 2023-06-05 DIAGNOSIS — D259 Leiomyoma of uterus, unspecified: Secondary | ICD-10-CM | POA: Diagnosis not present

## 2023-06-05 MED ORDER — GADOPICLENOL 0.5 MMOL/ML IV SOLN
10.0000 mL | Freq: Once | INTRAVENOUS | Status: AC | PRN
  Administered 2023-06-05: 10 mL via INTRAVENOUS

## 2023-06-23 ENCOUNTER — Telehealth: Payer: BC Managed Care – PPO | Admitting: Physician Assistant

## 2023-06-23 DIAGNOSIS — J019 Acute sinusitis, unspecified: Secondary | ICD-10-CM

## 2023-06-23 DIAGNOSIS — B9689 Other specified bacterial agents as the cause of diseases classified elsewhere: Secondary | ICD-10-CM

## 2023-06-23 MED ORDER — AMOXICILLIN-POT CLAVULANATE 875-125 MG PO TABS
1.0000 | ORAL_TABLET | Freq: Two times a day (BID) | ORAL | 0 refills | Status: AC
Start: 2023-06-23 — End: ?

## 2023-06-23 NOTE — Progress Notes (Signed)

## 2023-07-10 ENCOUNTER — Encounter: Payer: Self-pay | Admitting: Nurse Practitioner

## 2023-07-10 ENCOUNTER — Ambulatory Visit (INDEPENDENT_AMBULATORY_CARE_PROVIDER_SITE_OTHER): Payer: BC Managed Care – PPO | Admitting: Nurse Practitioner

## 2023-07-10 ENCOUNTER — Other Ambulatory Visit (HOSPITAL_COMMUNITY): Payer: Self-pay

## 2023-07-10 VITALS — BP 132/70 | HR 71 | Temp 97.5°F | Wt 211.0 lb

## 2023-07-10 DIAGNOSIS — L03032 Cellulitis of left toe: Secondary | ICD-10-CM

## 2023-07-10 MED ORDER — MUPIROCIN 2 % EX OINT
1.0000 | TOPICAL_OINTMENT | Freq: Two times a day (BID) | CUTANEOUS | 0 refills | Status: AC
Start: 2023-07-10 — End: ?

## 2023-07-10 MED ORDER — CEPHALEXIN 500 MG PO CAPS
500.0000 mg | ORAL_CAPSULE | Freq: Three times a day (TID) | ORAL | 0 refills | Status: AC
Start: 2023-07-10 — End: ?

## 2023-07-10 NOTE — Patient Instructions (Signed)
 Paronychia Paronychia is an infection of the skin. It happens near a fingernail or toenail. It may cause pain and swelling around the nail. In some cases, a fluid-filled bump (abscess) can form near or under the nail. Often, this condition is not serious, and it clears up with treatment. What are the causes? This condition may be caused by a germ. The germ may be bacteria or a fungus. These germs can enter the body through an opening in the skin, such as a cut or a hangnail. Other causes include: Repeated injuries to your fingernails or toenails. Irritation of the base and sides of the nail (cuticle). What increases the risk? This condition is more likely to develop in people who: Get their hands wet often, such as a dishwasher. Bite their fingernails or the base and sides of their nails. Have other skin problems. Have hangnails or hurt fingertips. Come into contact with chemicals like detergents. Have diabetes. What are the signs or symptoms? Redness and swelling of the skin near the nail. A tender feeling around the nail. Pus-filled bumps under the skin at the base and sides of the nail. Fluid or pus under the nail. Pain in the area. How is this treated? Treatment depends on the cause of your condition and how bad it is. If your condition is mild, it may clear up on its own in a few days or after soaking in warm water. If needed, treatment may include: Antibiotic medicine. Antifungal medicine. A procedure to drain pus from a fluid-filled bump. Medicine to treat irritation and swelling (corticosteroids). Taking off part of an ingrown toenail. A bandage (dressing) may be placed over the nail area. Follow these instructions at home: Wound care Keep the affected area clean. Soak the fingers or toes in warm water as told by your doctor. You may be told to do this for 20 minutes, 2-3 times a day. Keep the area dry when you are not soaking it. Do not try to drain a fluid-filled bump on  your own. Follow instructions from your doctor about how to take care of the affected area. Make sure you: Wash your hands with soap and water for at least 20 seconds before and after you change your bandage. If you cannot use soap and water, use hand sanitizer. Change your bandage as told by your doctor. If you had a fluid-filled bump and your doctor drained it, check the area every day for signs of infection. Check for: Redness, swelling, or pain. Fluid or blood. Warmth. Pus or a bad smell. Medicines  Take over-the-counter and prescription medicines only as told by your doctor. If you were prescribed an antibiotic medicine, take it as told by your doctor. Do not stop taking it even if you start to feel better. General instructions Avoid contact with anything that irritates your skin or that you are allergic to. Do not pick at the affected area. Keep all follow-up visits. Prevention To prevent this condition from happening again: Wear rubber gloves when putting your hands in water for washing dishes or other tasks. Wear gloves if your hands might touch cleaners or chemicals. Avoid injuring your nails or fingertips. Do not bite your nails or tear hangnails. Do not cut your nails very short. Do not cut the skin at the base and sides of the nail. Use clean nail clippers or scissors when trimming nails. Contact a doctor if: You feel worse. You do not get better. You keep having or you have more fluid, blood, or  pus coming from the affected area. Your affected finger, toe, or joint gets swollen or hard to move. You have a fever or chills. There is redness spreading from the affected area. Summary Paronychia is an infection of the skin. It happens near a fingernail or toenail. This condition may cause pain and swelling around the nail. Soak the fingers or toes in warm water as told by your doctor. Often, this condition is not serious, and it clears up with treatment. This information  is not intended to replace advice given to you by your health care provider. Make sure you discuss any questions you have with your health care provider. Document Revised: 09/04/2020 Document Reviewed: 09/04/2020 Elsevier Patient Education  2024 [employer].

## 2023-07-10 NOTE — Progress Notes (Signed)
Subjective   Patient ID: Ashley Garcia, female    DOB: Mar 30, 1966, 58 y.o.   MRN: [id number]  Chief Complaint  Patient presents with   Medical Management of Chronic Issues    Have been taking Wegovy and lost 14lbs   Nail Problem    Left great toe possible infection since Monday     Referring provider: Ivonne Andrew, NP  Ashley Garcia is a 58 y.o. female with Past Medical History: No date: Allergy No date: Arthritis No date: Depression No date: Hyperlipidemia   HPI  Patient presents today for follow-up on weight loss.  She is currently on Wegovy.  Overall she has been doing well.  She is trying to watch what she eats.  She does have some issues with slight constipation due to [facility] but she is following a high-fiber diet.  Patient is actually 14 pounds. Denies f/c/s, n/v/d, hemoptysis, PND, leg swelling. Denies chest pain or edema.  Patient does complain today of infection to left great toenail bed.  It does look like the area is draining.  We will order antibiotic and Bactroban ointment.  Patient will do warm soaks with Dial antibacterial soap twice a day.   No Known Allergies  Immunization History  Administered Date(s) Administered   PFIZER(Purple Top)SARS-COV-2 Vaccination 09/11/2019, 10/02/2019    Tobacco History: Social History   Tobacco Use  Smoking Status Former   Types: Cigars   Quit date: 2018   Years since quitting: 7.0  Smokeless Tobacco Never  Tobacco Comments   twice a month- cigar   Counseling given: Not Answered Tobacco comments: twice a month- cigar   Outpatient Encounter Medications as of 07/10/2023  Medication Sig   Ascorbic Acid (VITAMIN C) 1000 MG tablet Take 1,000 mg by mouth daily.   B Complex Vitamins (VITAMIN-B COMPLEX PO) Take 1 tablet by mouth daily.   Biotin 10 MG CAPS Take 1 capsule by mouth daily. [facility]   Brimonidine Tartrate (LUMIFY) 0.025 % SOLN Place 1 drop into both eyes 2 (two) times a week.    cephALEXin (KEFLEX) 500 MG capsule Take 1 capsule (500 mg total) by mouth 3 (three) times daily.   Cholecalciferol (VITAMIN D3) 50 MCG (2000 UT) TABS Take 2,000 Units by mouth daily.   fexofenadine (ALLEGRA) 180 MG tablet TAKE 1 TABLET BY MOUTH EVERY DAY   FLUoxetine (PROZAC) 40 MG capsule Take 40 mg by mouth daily.   MELATONIN PO Take 3 mg by mouth 3 (three) times a week. 1.5 mg eache Chewable gummy   mupirocin ointment (BACTROBAN) 2 % Apply 1 Application topically 2 (two) times daily.   rosuvastatin (CRESTOR) 10 MG tablet TAKE 1 TABLET BY MOUTH EVERY DAY   Semaglutide-Weight Management (WEGOVY) 2.4 MG/0.75ML SOAJ Inject 2.4 mg into the skin once a week.   Turmeric (QC TUMERIC COMPLEX) 500 MG CAPS Take 500 mg by mouth daily.   acetaminophen (TYLENOL) 500 MG tablet Take 1 tablet (500 mg total) by mouth every 6 (six) hours as needed.   amoxicillin-clavulanate (AUGMENTIN) 875-125 MG tablet Take 1 tablet by mouth 2 (two) times daily.   ibuprofen (ADVIL) 600 MG tablet Take 1 tablet (600 mg total) by mouth every 6 (six) hours as needed. (Patient not taking: Reported on 04/10/2023)   No facility-administered encounter medications on file as of 07/10/2023.    Review of Systems  Review of Systems  Constitutional: Negative.   HENT: Negative.    Cardiovascular: Negative.   Gastrointestinal: Negative.  Allergic/Immunologic: Negative.   Neurological: Negative.   Psychiatric/Behavioral: Negative.       Objective:   BP 132/70   Pulse 71   Temp (!) 97.5 F (36.4 C)   Wt 211 lb (95.7 kg)   LMP 06/08/2018 (Approximate)   SpO2 92%   BMI 34.06 kg/m   Wt Readings from Last 5 Encounters:  07/10/23 211 lb (95.7 kg)  04/15/23 218 lb 6.4 oz (99.1 kg)  04/10/23 218 lb (98.9 kg)  03/26/23 216 lb (98 kg)  03/13/23 220 lb 4.8 oz (99.9 kg)     Physical Exam Vitals and nursing note reviewed.  Constitutional:      General: She is not in acute distress.    Appearance: She is well-developed.   Cardiovascular:     Rate and Rhythm: Normal rate and regular rhythm.  Pulmonary:     Effort: Pulmonary effort is normal.     Breath sounds: Normal breath sounds.  Neurological:     Mental Status: She is alert and oriented to person, place, and time.       Assessment & Plan:   Paronychia of great toe, left -     Mupirocin; Apply 1 Application topically 2 (two) times daily.  Dispense: 22 g; Refill: 0 -     Cephalexin; Take 1 capsule (500 mg total) by mouth 3 (three) times daily.  Dispense: 30 capsule; Refill: 0     Return in about 3 months (around 10/08/2023) for Physical.   Ivonne Andrew, NP 07/10/2023

## 2023-08-07 ENCOUNTER — Other Ambulatory Visit (HOSPITAL_COMMUNITY)
Admission: RE | Admit: 2023-08-07 | Discharge: 2023-08-07 | Disposition: A | Discharge status: Home / Self Care | Payer: Self-pay | Source: Ambulatory Visit | Attending: Oncology | Admitting: Oncology

## 2023-08-08 ENCOUNTER — Other Ambulatory Visit (HOSPITAL_COMMUNITY): Payer: Self-pay

## 2023-08-17 LAB — GENECONNECT MOLECULAR SCREEN: Genetic Analysis Overall Interpretation: NEGATIVE

## 2023-10-16 DIAGNOSIS — Z1231 Encounter for screening mammogram for malignant neoplasm of breast: Secondary | ICD-10-CM | POA: Diagnosis not present

## 2023-10-22 ENCOUNTER — Ambulatory Visit: Payer: Self-pay | Admitting: Nurse Practitioner

## 2023-10-23 ENCOUNTER — Ambulatory Visit: Payer: Self-pay | Admitting: Nurse Practitioner

## 2023-11-16 ENCOUNTER — Other Ambulatory Visit: Payer: Self-pay | Admitting: Nurse Practitioner

## 2024-01-19 ENCOUNTER — Encounter: Payer: Self-pay | Admitting: Nurse Practitioner

## 2024-01-19 ENCOUNTER — Ambulatory Visit (INDEPENDENT_AMBULATORY_CARE_PROVIDER_SITE_OTHER): Admitting: Nurse Practitioner

## 2024-01-19 VITALS — BP 109/63 | HR 66 | Temp 98.6°F | Wt 214.8 lb

## 2024-01-19 DIAGNOSIS — Z0000 Encounter for general adult medical examination without abnormal findings: Secondary | ICD-10-CM | POA: Diagnosis not present

## 2024-01-19 DIAGNOSIS — Z13220 Encounter for screening for lipoid disorders: Secondary | ICD-10-CM | POA: Diagnosis not present

## 2024-01-19 DIAGNOSIS — E559 Vitamin D deficiency, unspecified: Secondary | ICD-10-CM

## 2024-01-19 DIAGNOSIS — E66812 Obesity, class 2: Secondary | ICD-10-CM | POA: Diagnosis not present

## 2024-01-19 DIAGNOSIS — Z1329 Encounter for screening for other suspected endocrine disorder: Secondary | ICD-10-CM | POA: Diagnosis not present

## 2024-01-19 DIAGNOSIS — E6609 Other obesity due to excess calories: Secondary | ICD-10-CM

## 2024-01-19 DIAGNOSIS — Z6835 Body mass index (BMI) 35.0-35.9, adult: Secondary | ICD-10-CM | POA: Diagnosis not present

## 2024-01-19 MED ORDER — FLUOXETINE HCL 40 MG PO CAPS: 40.0000 mg | ORAL_CAPSULE | Freq: Every day | ORAL | 1 refills | Status: AC

## 2024-01-19 MED ORDER — TIRZEPATIDE-WEIGHT MANAGEMENT 2.5 MG/0.5ML ~~LOC~~ SOLN
2.5000 mg | SUBCUTANEOUS | 3 refills | Status: AC
Start: 2024-01-19 — End: ?

## 2024-01-19 NOTE — Progress Notes (Unsigned)
 Subjective   Patient ID: Ashley Garcia, female    DOB: 07/03/1965, 58 y.o.   MRN: [id number]  Chief Complaint  Patient presents with   Annual Exam    Referring provider: Oley Bascom RAMAN, NP  Hervey CHRISTELLA Sick is a 59 y.o. female with Past Medical History: No date: Allergy No date: Arthritis No date: Depression No date: Hyperlipidemia   HPI  Would like to switch to zepbound . Has stopped wegovy . Has stopped losing weight. Patient has been fasting.     No Known Allergies  Immunization History  Administered Date(s) Administered   PFIZER(Purple Top)SARS-COV-2 Vaccination 09/11/2019, 10/02/2019    Tobacco History: Social History   Tobacco Use  Smoking Status Former   Types: Cigars   Quit date: 2018   Years since quitting: 7.5  Smokeless Tobacco Never  Tobacco Comments   twice a month- cigar   Counseling given: Not Answered Tobacco comments: twice a month- cigar   Outpatient Encounter Medications as of 01/19/2024  Medication Sig   Ascorbic Acid (VITAMIN C) 1000 MG tablet Take 1,000 mg by mouth daily.   B Complex Vitamins (VITAMIN-B COMPLEX PO) Take 1 tablet by mouth daily.   Biotin 10 MG CAPS Take 1 capsule by mouth daily. Advance Skin [facility] Hair   Brimonidine Tartrate (LUMIFY) 0.025 % SOLN Place 1 drop into both eyes 2 (two) times a week.   Cholecalciferol (VITAMIN D3) 50 MCG (2000 UT) TABS Take 2,000 Units by mouth daily.   fexofenadine  (ALLEGRA ) 180 MG tablet TAKE 1 TABLET BY MOUTH EVERY DAY   MELATONIN PO Take 3 mg by mouth 3 (three) times a week. 1.5 mg eache Chewable gummy   rosuvastatin  (CRESTOR ) 10 MG tablet TAKE 1 TABLET BY MOUTH EVERY DAY   tirzepatide  (ZEPBOUND ) 2.5 MG/0.5ML injection vial Inject 2.5 mg into the skin once a week.   Turmeric (QC TUMERIC COMPLEX) 500 MG CAPS Take 500 mg by mouth daily.   [DISCONTINUED] FLUoxetine  (PROZAC ) 40 MG capsule Take 40 mg by mouth daily.   acetaminophen  (TYLENOL ) 500 MG tablet Take 1 tablet (500 mg total) by  mouth every 6 (six) hours as needed.   amoxicillin -clavulanate (AUGMENTIN ) 875-125 MG tablet Take 1 tablet by mouth 2 (two) times daily.   cephALEXin  (KEFLEX ) 500 MG capsule Take 1 capsule (500 mg total) by mouth 3 (three) times daily.   FLUoxetine  (PROZAC ) 40 MG capsule Take 1 capsule (40 mg total) by mouth daily.   ibuprofen  (ADVIL ) 600 MG tablet Take 1 tablet (600 mg total) by mouth every 6 (six) hours as needed. (Patient not taking: Reported on 04/10/2023)   mupirocin  ointment (BACTROBAN ) 2 % Apply 1 Application topically 2 (two) times daily.   Semaglutide -Weight Management (WEGOVY ) 2.4 MG/0.75ML SOAJ Inject 2.4 mg into the skin once a week.   No facility-administered encounter medications on file as of 01/19/2024.    Review of Systems  Review of Systems  Constitutional: Negative.   HENT: Negative.    Cardiovascular: Negative.   Gastrointestinal: Negative.   Allergic/Immunologic: Negative.   Neurological: Negative.   Psychiatric/Behavioral: Negative.       Objective:   BP 109/63   Pulse 66   Temp 98.6 F (37 C) (Oral)   Wt 214 lb 12.8 oz (97.4 kg)   LMP 06/08/2018 (Approximate)   SpO2 98%   BMI 34.67 kg/m   Wt Readings from Last 5 Encounters:  01/19/24 214 lb 12.8 oz (97.4 kg)  07/10/23 211 lb (95.7 kg)  04/15/23 218 lb  6.4 oz (99.1 kg)  04/10/23 218 lb (98.9 kg)  03/26/23 216 lb (98 kg)     Physical Exam Vitals and nursing note reviewed.  Constitutional:      General: She is not in acute distress.    Appearance: She is well-developed.  Cardiovascular:     Rate and Rhythm: Normal rate and regular rhythm.  Pulmonary:     Effort: Pulmonary effort is normal.     Breath sounds: Normal breath sounds.  Neurological:     Mental Status: She is alert and oriented to person, place, and time.     {Labs (Optional):23779}  Assessment & Plan:   Class 2 obesity due to excess calories without serious comorbidity with body mass index (BMI) of 35.0 to 35.9 in adult -      Tirzepatide -Weight Management; Inject 2.5 mg into the skin once a week.  Dispense: 0.5 mL; Refill: 3 -     CBC -     Comprehensive metabolic panel with GFR -     Lipid panel -     TSH -     VITAMIN D  25 Hydroxy (Vit-D Deficiency, Fractures)  Lipid screening -     Lipid panel  Routine adult health maintenance -     CBC -     Comprehensive metabolic panel with GFR -     Lipid panel -     TSH -     VITAMIN D  25 Hydroxy (Vit-D Deficiency, Fractures)  Thyroid  disorder screen -     TSH  Vitamin D  deficiency -     VITAMIN D  25 Hydroxy (Vit-D Deficiency, Fractures)  Other orders -     FLUoxetine  HCl; Take 1 capsule (40 mg total) by mouth daily.  Dispense: 90 capsule; Refill: 1     Return in about 6 months (around 07/21/2024).     Bascom GORMAN Borer, NP 01/19/2024

## 2024-01-20 ENCOUNTER — Telehealth: Payer: Self-pay

## 2024-01-20 LAB — LIPID PANEL
Chol/HDL Ratio: 3.7 ratio (ref 0.0–4.4)
Cholesterol, Total: 223 mg/dL — ABNORMAL HIGH (ref 100–199)
HDL: 60 mg/dL (ref >39)
LDL Chol Calc (NIH): 112 mg/dL — ABNORMAL HIGH (ref 0–99)
Triglycerides: 297 mg/dL — ABNORMAL HIGH (ref 0–149)
VLDL Cholesterol Cal: 51 mg/dL — ABNORMAL HIGH (ref 5–40)

## 2024-01-20 LAB — COMPREHENSIVE METABOLIC PANEL WITH GFR
ALT: 31 IU/L (ref 0–32)
AST: 39 IU/L (ref 0–40)
Albumin: 4.2 g/dL (ref 3.8–4.9)
Alkaline Phosphatase: 77 IU/L (ref 44–121)
BUN/Creatinine Ratio: 17 (ref 9–23)
BUN: 16 mg/dL (ref 6–24)
Bilirubin Total: 0.3 mg/dL (ref 0.0–1.2)
CO2: 22 mmol/L (ref 20–29)
Calcium: 9.6 mg/dL (ref 8.7–10.2)
Chloride: 106 mmol/L (ref 96–106)
Creatinine, Ser: 0.92 mg/dL (ref 0.57–1.00)
Globulin, Total: 2.3 g/dL (ref 1.5–4.5)
Glucose: 86 mg/dL (ref 70–99)
Potassium: 4.7 mmol/L (ref 3.5–5.2)
Sodium: 141 mmol/L (ref 134–144)
Total Protein: 6.5 g/dL (ref 6.0–8.5)
eGFR: 73 mL/min/1.73 (ref >59)

## 2024-01-20 LAB — CBC
Hematocrit: 39.8 % (ref 34.0–46.6)
Hemoglobin: 13.2 g/dL (ref 11.1–15.9)
MCH: 31.9 pg (ref 26.6–33.0)
MCHC: 33.2 g/dL (ref 31.5–35.7)
MCV: 96 fL (ref 79–97)
Platelets: 359 x10E3/uL (ref 150–450)
RBC: 4.14 x10E6/uL (ref 3.77–5.28)
RDW: 12.6 % (ref 11.7–15.4)
WBC: 9.5 x10E3/uL (ref 3.4–10.8)

## 2024-01-20 LAB — TSH: TSH: 2.58 u[IU]/mL (ref 0.450–4.500)

## 2024-01-20 LAB — VITAMIN D 25 HYDROXY (VIT D DEFICIENCY, FRACTURES): Vit D, 25-Hydroxy: 38.1 ng/mL (ref 30.0–100.0)

## 2024-01-20 NOTE — Telephone Encounter (Signed)
 Pharmacy Patient Advocate Encounter  Received notification from [facility] that Prior Authorization for ZEPBOUND  has been APPROVED from 01/20/2024 to 07/17/2024

## 2024-01-21 ENCOUNTER — Ambulatory Visit: Payer: Self-pay | Admitting: Nurse Practitioner

## 2024-01-29 ENCOUNTER — Other Ambulatory Visit: Payer: Self-pay | Admitting: Nurse Practitioner

## 2024-01-29 MED ORDER — FEXOFENADINE-PSEUDOEPHED ER 60-120 MG PO TB12: 1.0000 | ORAL_TABLET | Freq: Two times a day (BID) | ORAL | 0 refills | Status: AC

## 2024-01-29 MED ORDER — AMOXICILLIN 875 MG PO TABS: 875.0000 mg | ORAL_TABLET | Freq: Two times a day (BID) | ORAL | 0 refills | Status: AC

## 2024-03-30 DIAGNOSIS — M9903 Segmental and somatic dysfunction of lumbar region: Secondary | ICD-10-CM | POA: Diagnosis not present

## 2024-03-30 DIAGNOSIS — M9904 Segmental and somatic dysfunction of sacral region: Secondary | ICD-10-CM | POA: Diagnosis not present

## 2024-03-30 DIAGNOSIS — M5136 Other intervertebral disc degeneration, lumbar region with discogenic back pain only: Secondary | ICD-10-CM | POA: Diagnosis not present

## 2024-03-30 DIAGNOSIS — M9901 Segmental and somatic dysfunction of cervical region: Secondary | ICD-10-CM | POA: Diagnosis not present

## 2024-03-30 DIAGNOSIS — M9905 Segmental and somatic dysfunction of pelvic region: Secondary | ICD-10-CM | POA: Diagnosis not present

## 2024-04-01 DIAGNOSIS — M5136 Other intervertebral disc degeneration, lumbar region with discogenic back pain only: Secondary | ICD-10-CM | POA: Diagnosis not present

## 2024-04-01 DIAGNOSIS — M9904 Segmental and somatic dysfunction of sacral region: Secondary | ICD-10-CM | POA: Diagnosis not present

## 2024-04-01 DIAGNOSIS — M9905 Segmental and somatic dysfunction of pelvic region: Secondary | ICD-10-CM | POA: Diagnosis not present

## 2024-04-01 DIAGNOSIS — M9903 Segmental and somatic dysfunction of lumbar region: Secondary | ICD-10-CM | POA: Diagnosis not present

## 2024-04-06 DIAGNOSIS — M9905 Segmental and somatic dysfunction of pelvic region: Secondary | ICD-10-CM | POA: Diagnosis not present

## 2024-04-06 DIAGNOSIS — M5136 Other intervertebral disc degeneration, lumbar region with discogenic back pain only: Secondary | ICD-10-CM | POA: Diagnosis not present

## 2024-04-06 DIAGNOSIS — M9903 Segmental and somatic dysfunction of lumbar region: Secondary | ICD-10-CM | POA: Diagnosis not present

## 2024-04-06 DIAGNOSIS — M9904 Segmental and somatic dysfunction of sacral region: Secondary | ICD-10-CM | POA: Diagnosis not present

## 2024-04-08 DIAGNOSIS — M9904 Segmental and somatic dysfunction of sacral region: Secondary | ICD-10-CM | POA: Diagnosis not present

## 2024-04-08 DIAGNOSIS — M5136 Other intervertebral disc degeneration, lumbar region with discogenic back pain only: Secondary | ICD-10-CM | POA: Diagnosis not present

## 2024-04-08 DIAGNOSIS — M9905 Segmental and somatic dysfunction of pelvic region: Secondary | ICD-10-CM | POA: Diagnosis not present

## 2024-04-08 DIAGNOSIS — M9903 Segmental and somatic dysfunction of lumbar region: Secondary | ICD-10-CM | POA: Diagnosis not present

## 2024-04-13 DIAGNOSIS — M9903 Segmental and somatic dysfunction of lumbar region: Secondary | ICD-10-CM | POA: Diagnosis not present

## 2024-04-13 DIAGNOSIS — M5136 Other intervertebral disc degeneration, lumbar region with discogenic back pain only: Secondary | ICD-10-CM | POA: Diagnosis not present

## 2024-04-13 DIAGNOSIS — M9904 Segmental and somatic dysfunction of sacral region: Secondary | ICD-10-CM | POA: Diagnosis not present

## 2024-04-13 DIAGNOSIS — M9905 Segmental and somatic dysfunction of pelvic region: Secondary | ICD-10-CM | POA: Diagnosis not present

## 2024-04-15 DIAGNOSIS — M9905 Segmental and somatic dysfunction of pelvic region: Secondary | ICD-10-CM | POA: Diagnosis not present

## 2024-04-15 DIAGNOSIS — M5136 Other intervertebral disc degeneration, lumbar region with discogenic back pain only: Secondary | ICD-10-CM | POA: Diagnosis not present

## 2024-04-15 DIAGNOSIS — M9903 Segmental and somatic dysfunction of lumbar region: Secondary | ICD-10-CM | POA: Diagnosis not present

## 2024-04-15 DIAGNOSIS — M9904 Segmental and somatic dysfunction of sacral region: Secondary | ICD-10-CM | POA: Diagnosis not present

## 2024-04-19 DIAGNOSIS — M9905 Segmental and somatic dysfunction of pelvic region: Secondary | ICD-10-CM | POA: Diagnosis not present

## 2024-04-19 DIAGNOSIS — M9903 Segmental and somatic dysfunction of lumbar region: Secondary | ICD-10-CM | POA: Diagnosis not present

## 2024-04-19 DIAGNOSIS — M5136 Other intervertebral disc degeneration, lumbar region with discogenic back pain only: Secondary | ICD-10-CM | POA: Diagnosis not present

## 2024-04-19 DIAGNOSIS — M9904 Segmental and somatic dysfunction of sacral region: Secondary | ICD-10-CM | POA: Diagnosis not present

## 2024-04-26 DIAGNOSIS — M5136 Other intervertebral disc degeneration, lumbar region with discogenic back pain only: Secondary | ICD-10-CM | POA: Diagnosis not present

## 2024-04-26 DIAGNOSIS — M9905 Segmental and somatic dysfunction of pelvic region: Secondary | ICD-10-CM | POA: Diagnosis not present

## 2024-04-26 DIAGNOSIS — M9903 Segmental and somatic dysfunction of lumbar region: Secondary | ICD-10-CM | POA: Diagnosis not present

## 2024-04-26 DIAGNOSIS — M9904 Segmental and somatic dysfunction of sacral region: Secondary | ICD-10-CM | POA: Diagnosis not present

## 2024-05-04 DIAGNOSIS — M5136 Other intervertebral disc degeneration, lumbar region with discogenic back pain only: Secondary | ICD-10-CM | POA: Diagnosis not present

## 2024-05-04 DIAGNOSIS — M9904 Segmental and somatic dysfunction of sacral region: Secondary | ICD-10-CM | POA: Diagnosis not present

## 2024-05-04 DIAGNOSIS — M9903 Segmental and somatic dysfunction of lumbar region: Secondary | ICD-10-CM | POA: Diagnosis not present

## 2024-05-04 DIAGNOSIS — M9905 Segmental and somatic dysfunction of pelvic region: Secondary | ICD-10-CM | POA: Diagnosis not present

## 2024-05-11 DIAGNOSIS — M9903 Segmental and somatic dysfunction of lumbar region: Secondary | ICD-10-CM | POA: Diagnosis not present

## 2024-05-11 DIAGNOSIS — M9904 Segmental and somatic dysfunction of sacral region: Secondary | ICD-10-CM | POA: Diagnosis not present

## 2024-05-11 DIAGNOSIS — M9905 Segmental and somatic dysfunction of pelvic region: Secondary | ICD-10-CM | POA: Diagnosis not present

## 2024-05-11 DIAGNOSIS — M5136 Other intervertebral disc degeneration, lumbar region with discogenic back pain only: Secondary | ICD-10-CM | POA: Diagnosis not present

## 2024-05-18 DIAGNOSIS — M9903 Segmental and somatic dysfunction of lumbar region: Secondary | ICD-10-CM | POA: Diagnosis not present

## 2024-05-18 DIAGNOSIS — M5136 Other intervertebral disc degeneration, lumbar region with discogenic back pain only: Secondary | ICD-10-CM | POA: Diagnosis not present

## 2024-05-18 DIAGNOSIS — M9905 Segmental and somatic dysfunction of pelvic region: Secondary | ICD-10-CM | POA: Diagnosis not present

## 2024-05-18 DIAGNOSIS — M9904 Segmental and somatic dysfunction of sacral region: Secondary | ICD-10-CM | POA: Diagnosis not present

## 2024-05-20 DIAGNOSIS — M9903 Segmental and somatic dysfunction of lumbar region: Secondary | ICD-10-CM | POA: Diagnosis not present

## 2024-05-20 DIAGNOSIS — M9905 Segmental and somatic dysfunction of pelvic region: Secondary | ICD-10-CM | POA: Diagnosis not present

## 2024-05-20 DIAGNOSIS — M5136 Other intervertebral disc degeneration, lumbar region with discogenic back pain only: Secondary | ICD-10-CM | POA: Diagnosis not present

## 2024-05-20 DIAGNOSIS — M9904 Segmental and somatic dysfunction of sacral region: Secondary | ICD-10-CM | POA: Diagnosis not present

## 2024-06-01 DIAGNOSIS — M9905 Segmental and somatic dysfunction of pelvic region: Secondary | ICD-10-CM | POA: Diagnosis not present

## 2024-06-01 DIAGNOSIS — M9904 Segmental and somatic dysfunction of sacral region: Secondary | ICD-10-CM | POA: Diagnosis not present

## 2024-06-01 DIAGNOSIS — M9903 Segmental and somatic dysfunction of lumbar region: Secondary | ICD-10-CM | POA: Diagnosis not present

## 2024-06-16 DIAGNOSIS — M9905 Segmental and somatic dysfunction of pelvic region: Secondary | ICD-10-CM | POA: Diagnosis not present

## 2024-06-16 DIAGNOSIS — M9903 Segmental and somatic dysfunction of lumbar region: Secondary | ICD-10-CM | POA: Diagnosis not present

## 2024-06-16 DIAGNOSIS — M9904 Segmental and somatic dysfunction of sacral region: Secondary | ICD-10-CM | POA: Diagnosis not present

## 2024-07-21 ENCOUNTER — Ambulatory Visit: Payer: Self-pay | Admitting: Nurse Practitioner
